# Patient Record
Sex: Female | Born: 2000 | Hispanic: Yes | Marital: Single | Smoking: Never smoker
Health system: Southern US, Community
[De-identification: ages and names within clinical notes are randomized; demographics above are authoritative.]

---

## 2021-03-13 ENCOUNTER — Emergency Department: Payer: Medicaid Other

## 2021-03-13 ENCOUNTER — Other Ambulatory Visit: Payer: Self-pay

## 2021-03-13 ENCOUNTER — Encounter: Payer: Self-pay | Admitting: Emergency Medicine

## 2021-03-13 DIAGNOSIS — R1013 Epigastric pain: Secondary | ICD-10-CM | POA: Diagnosis not present

## 2021-03-13 DIAGNOSIS — R0789 Other chest pain: Secondary | ICD-10-CM | POA: Insufficient documentation

## 2021-03-13 DIAGNOSIS — R110 Nausea: Secondary | ICD-10-CM | POA: Insufficient documentation

## 2021-03-13 DIAGNOSIS — R051 Acute cough: Secondary | ICD-10-CM | POA: Diagnosis not present

## 2021-03-13 DIAGNOSIS — R0981 Nasal congestion: Secondary | ICD-10-CM | POA: Diagnosis not present

## 2021-03-13 DIAGNOSIS — Z20822 Contact with and (suspected) exposure to covid-19: Secondary | ICD-10-CM | POA: Diagnosis not present

## 2021-03-13 DIAGNOSIS — E860 Dehydration: Secondary | ICD-10-CM | POA: Insufficient documentation

## 2021-03-13 LAB — URINALYSIS, COMPLETE (UACMP) WITH MICROSCOPIC
Bacteria, UA: NONE SEEN
Bilirubin Urine: NEGATIVE
Glucose, UA: NEGATIVE mg/dL
Ketones, ur: 20 mg/dL — AB
Leukocytes,Ua: NEGATIVE
Nitrite: NEGATIVE
Protein, ur: NEGATIVE mg/dL
Specific Gravity, Urine: 1.024 (ref 1.005–1.030)
pH: 5 (ref 5.0–8.0)

## 2021-03-13 LAB — COMPREHENSIVE METABOLIC PANEL
ALT: 17 U/L (ref 0–44)
AST: 14 U/L — ABNORMAL LOW (ref 15–41)
Albumin: 4.3 g/dL (ref 3.5–5.0)
Alkaline Phosphatase: 60 U/L (ref 38–126)
Anion gap: 8 (ref 5–15)
BUN: 13 mg/dL (ref 6–20)
CO2: 26 mmol/L (ref 22–32)
Calcium: 9.2 mg/dL (ref 8.9–10.3)
Chloride: 103 mmol/L (ref 98–111)
Creatinine, Ser: 0.42 mg/dL — ABNORMAL LOW (ref 0.44–1.00)
GFR, Estimated: >60 mL/min (ref >60)
Glucose, Bld: 89 mg/dL (ref 70–99)
Potassium: 3.7 mmol/L (ref 3.5–5.1)
Sodium: 137 mmol/L (ref 135–145)
Total Bilirubin: 0.9 mg/dL (ref 0.3–1.2)
Total Protein: 7.4 g/dL (ref 6.5–8.1)

## 2021-03-13 LAB — CBC
HCT: 37.1 % (ref 36.0–46.0)
Hemoglobin: 12.8 g/dL (ref 12.0–15.0)
MCH: 30.2 pg (ref 26.0–34.0)
MCHC: 34.5 g/dL (ref 30.0–36.0)
MCV: 87.5 fL (ref 80.0–100.0)
Platelets: 270 10*3/uL (ref 150–400)
RBC: 4.24 MIL/uL (ref 3.87–5.11)
RDW: 13.9 % (ref 11.5–15.5)
WBC: 6.1 10*3/uL (ref 4.0–10.5)
nRBC: 0 % (ref 0.0–0.2)

## 2021-03-13 LAB — LIPASE, BLOOD: Lipase: 27 U/L (ref 11–51)

## 2021-03-13 NOTE — ED Triage Notes (Signed)
Pt states she woke up at around 3 in the morning with abdominal pain. Pt states she also feels congestion. Pt reports a cough and nausea with this cough.

## 2021-03-14 ENCOUNTER — Other Ambulatory Visit: Payer: Self-pay

## 2021-03-14 ENCOUNTER — Emergency Department
Admission: EM | Admit: 2021-03-14 | Discharge: 2021-03-14 | Disposition: A | Discharge status: Home / Self Care | Payer: Medicaid Other | Attending: Emergency Medicine | Admitting: Emergency Medicine

## 2021-03-14 DIAGNOSIS — R051 Acute cough: Secondary | ICD-10-CM

## 2021-03-14 DIAGNOSIS — E860 Dehydration: Secondary | ICD-10-CM

## 2021-03-14 DIAGNOSIS — R079 Chest pain, unspecified: Secondary | ICD-10-CM

## 2021-03-14 LAB — RESP PANEL BY RT-PCR (FLU A&B, COVID) ARPGX2
Influenza A by PCR: NEGATIVE
Influenza B by PCR: NEGATIVE
SARS Coronavirus 2 by RT PCR: NEGATIVE

## 2021-03-14 LAB — D-DIMER, QUANTITATIVE: D-Dimer, Quant: 0.31 ug/mL-FEU (ref 0.00–0.50)

## 2021-03-14 LAB — PREGNANCY, URINE: Preg Test, Ur: NEGATIVE

## 2021-03-14 MED ORDER — HYDROCOD POLST-CPM POLST ER 10-8 MG/5ML PO SUER
5.0000 mL | Freq: Once | ORAL | Status: AC
  Administered 2021-03-14: 5 mL via ORAL
  Filled 2021-03-14: qty 5

## 2021-03-14 MED ORDER — SODIUM CHLORIDE 0.9 % IV BOLUS
1000.0000 mL | Freq: Once | INTRAVENOUS | Status: AC
  Administered 2021-03-14: 1000 mL via INTRAVENOUS

## 2021-03-14 MED ORDER — HYDROCOD POLST-CPM POLST ER 10-8 MG/5ML PO SUER: 5.0000 mL | Freq: Two times a day (BID) | ORAL | 0 refills | Status: DC

## 2021-03-14 MED ORDER — KETOROLAC TROMETHAMINE 30 MG/ML IJ SOLN
15.0000 mg | Freq: Once | INTRAMUSCULAR | Status: AC
  Administered 2021-03-14: 15 mg via INTRAVENOUS
  Filled 2021-03-14: qty 1

## 2021-03-14 NOTE — ED Provider Notes (Signed)
[facility] Emergency Department Provider Note   ____________________________________________   Event Date/Time   First MD Initiated Contact with Patient 03/14/21 0154     (approximate)  I have reviewed the triage vital signs and the nursing notes.   HISTORY  Chief Complaint Abdominal Pain    HPI Latoya Perkins is a 20 y.o. female who presents to the ED from home with a chief complaint of upper abdominal/lower chest pain.  Awoke around 3 AM with discomfort.  Endorses several day history of cough, congestion and nausea.  Denies fever, shortness of breath, vomiting, dysuria or diarrhea.  Denies recent travel, trauma or OCP use      Past medical history None  There are no problems to display for this patient.   History reviewed. No pertinent surgical history.  Prior to Admission medications   Medication Sig Start Date End Date Taking? Authorizing Provider  chlorpheniramine-HYDROcodone (TUSSIONEX PENNKINETIC ER) 10-8 MG/5ML SUER Take 5 mLs by mouth 2 (two) times daily. 03/14/21  Yes Irean Hong, MD    Allergies Patient has no allergy information on record.  No family history on file.  Social History    Review of Systems  Constitutional: No fever/chills Eyes: No visual changes. ENT: Positive for congestion.  No sore throat. Cardiovascular: Positive for chest pain. Respiratory: Positive for cough.  Denies shortness of breath. Gastrointestinal: No abdominal pain.  Positive for nausea, no vomiting.  No diarrhea.  No constipation. Genitourinary: Negative for dysuria. Musculoskeletal: Negative for back pain. Skin: Negative for rash. Neurological: Negative for headaches, focal weakness or numbness.   ____________________________________________   PHYSICAL EXAM:  VITAL SIGNS: ED Triage Vitals  Enc Vitals Group     BP 03/13/21 1633 98/73     Pulse Rate 03/13/21 1633 97     Resp 03/13/21 1633 16     Temp 03/13/21 1633 98.4 F  (36.9 C)     Temp Source 03/13/21 1633 Oral     SpO2 03/13/21 1633 98 %     Weight --      Height --      Head Circumference --      Peak Flow --      Pain Score 03/13/21 1634 6     Pain Loc --      Pain Edu? --      Excl. in GC? --     Constitutional: Alert and oriented. Well appearing and in no acute distress. Eyes: Conjunctivae are normal. PERRL. EOMI. Head: Atraumatic. Nose: Congestion/rhinnorhea. Mouth/Throat: Mucous membranes are moist.  Oropharynx non-erythematous. Neck: No stridor.   Cardiovascular: Normal rate, regular rhythm. Grossly normal heart sounds.  Good peripheral circulation.  Tender to palpation xiphoid process. Respiratory: Dry cough noted.  Normal respiratory effort.  No retractions. Lungs CTAB. Gastrointestinal: Soft and nontender. No distention. No abdominal bruits. No CVA tenderness. Musculoskeletal: No lower extremity tenderness nor edema.  No joint effusions. Neurologic:  Normal speech and language. No gross focal neurologic deficits are appreciated. No gait instability. Skin:  Skin is warm, dry and intact. No rash noted. Psychiatric: Mood and affect are normal. Speech and behavior are normal.  ____________________________________________   LABS (all labs ordered are listed, but only abnormal results are displayed)  Labs Reviewed  COMPREHENSIVE METABOLIC PANEL - Abnormal; Notable for the following components:      Result Value   Creatinine, Ser 0.42 (*)    AST 14 (*)    All other components within normal limits  URINALYSIS,  COMPLETE (UACMP) WITH MICROSCOPIC - Abnormal; Notable for the following components:   Color, Urine YELLOW (*)    APPearance HAZY (*)    Hgb urine dipstick SMALL (*)    Ketones, ur 20 (*)    All other components within normal limits  RESP PANEL BY RT-PCR (FLU A&B, COVID) ARPGX2  LIPASE, BLOOD  CBC  PREGNANCY, URINE  D-DIMER, QUANTITATIVE  POC URINE PREG, ED   ____________________________________________  EKG  ED ECG  REPORT I, Raynold Blankenbaker J, the attending physician, personally viewed and interpreted this ECG.   Date: 03/14/2021  EKG Time: 0330  Rate: 83  Rhythm: normal sinus rhythm  Axis: Normal  Intervals:none  ST&T Change: Nonspecific  ____________________________________________  RADIOLOGY I, Bertha Earwood J, personally viewed and evaluated these images (plain radiographs) as part of my medical decision making, as well as reviewing the written report by the radiologist.  ED MD interpretation: No acute cardiopulmonary process  Official radiology report(s): DG Chest 2 View  Result Date: 03/13/2021 CLINICAL DATA:  Cough EXAM: CHEST - 2 VIEW COMPARISON:  None. FINDINGS: The heart size and mediastinal contours are within normal limits. Both lungs are clear. Scoliotic curvature of the thoracolumbar spine. IMPRESSION: No acute cardiopulmonary disease. Electronically Signed   By: Maudry Mayhew M.D.   On: 03/13/2021 23:44    ____________________________________________   PROCEDURES  Procedure(s) performed (including Critical Care):  Procedures   ____________________________________________   INITIAL IMPRESSION / ASSESSMENT AND PLAN / ED COURSE  As part of my medical decision making, I reviewed the following data within the electronic MEDICAL RECORD NUMBER Nursing notes reviewed and incorporated, Labs reviewed, EKG interpreted, Old chart reviewed, Radiograph reviewed, and Notes from prior ED visits     20 year old female presenting with cough, congestion, nausea, chest/epigastric pain. Differential diagnosis includes, but is not limited to, ACS, aortic dissection, pulmonary embolism, cardiac tamponade, pneumothorax, pneumonia, pericarditis, myocarditis, GI-related causes including esophagitis/gastritis, and musculoskeletal chest wall pain.     Laboratory results remarkable for ketonuria.  Will check respiratory panel, D-dimer, EKG.  Administer IV fluid hydration, Toradol and Tussionex for cough.  Will  reassess.  Clinical Course as of 03/14/21 8469  Sat Mar 14, 2021  [id number] Patient feeling better, texting on her cell phone.  Updated her on all test results.  Strict return precautions given.  Patient verbalizes understanding and agrees with plan of care. [JS]    Clinical Course User Index [JS] Irean Hong, MD     ____________________________________________   FINAL CLINICAL IMPRESSION(S) / ED DIAGNOSES  Final diagnoses:  Acute cough  Dehydration  Chest pain, unspecified type     ED Discharge Orders          Ordered    chlorpheniramine-HYDROcodone (TUSSIONEX PENNKINETIC ER) 10-8 MG/5ML SUER  2 times daily        03/14/21 0438             Note:  This document was prepared using Dragon voice recognition software and may include unintentional dictation errors.    Irean Hong, MD 03/14/21 [phone]

## 2021-03-14 NOTE — Discharge Instructions (Signed)
You may take Tylenol and/or Ibuprofen as needed.  Take Tussionex as needed for cough.  Drink plenty of fluids daily.  Return to the ER for worsening symptoms, persistent vomiting, difficulty breathing or other concerns.

## 2021-06-12 ENCOUNTER — Emergency Department
Admission: EM | Admit: 2021-06-12 | Discharge: 2021-06-12 | Discharge status: Against Medical Advice | Payer: Medicaid Other | Source: Home / Self Care

## 2021-06-16 ENCOUNTER — Other Ambulatory Visit: Payer: Self-pay

## 2021-06-16 ENCOUNTER — Emergency Department: Payer: Medicaid Other

## 2021-06-16 ENCOUNTER — Emergency Department
Admission: EM | Admit: 2021-06-16 | Discharge: 2021-06-16 | Disposition: A | Discharge status: Home / Self Care | Payer: Medicaid Other | Attending: Emergency Medicine | Admitting: Emergency Medicine

## 2021-06-16 ENCOUNTER — Encounter: Payer: Self-pay | Admitting: *Deleted

## 2021-06-16 DIAGNOSIS — N10 Acute tubulo-interstitial nephritis: Secondary | ICD-10-CM | POA: Insufficient documentation

## 2021-06-16 DIAGNOSIS — R319 Hematuria, unspecified: Secondary | ICD-10-CM | POA: Diagnosis present

## 2021-06-16 DIAGNOSIS — N12 Tubulo-interstitial nephritis, not specified as acute or chronic: Secondary | ICD-10-CM

## 2021-06-16 LAB — URINALYSIS, ROUTINE W REFLEX MICROSCOPIC
Bilirubin Urine: NEGATIVE
Glucose, UA: NEGATIVE mg/dL
Ketones, ur: 5 mg/dL — AB
Nitrite: NEGATIVE
Protein, ur: 100 mg/dL — AB
RBC / HPF: >50 RBC/hpf — ABNORMAL HIGH (ref 0–5)
Specific Gravity, Urine: 1.023 (ref 1.005–1.030)
WBC, UA: >50 WBC/hpf — ABNORMAL HIGH (ref 0–5)
pH: 5 (ref 5.0–8.0)

## 2021-06-16 LAB — CBC
HCT: 33.8 % — ABNORMAL LOW (ref 36.0–46.0)
Hemoglobin: 11.4 g/dL — ABNORMAL LOW (ref 12.0–15.0)
MCH: 29.6 pg (ref 26.0–34.0)
MCHC: 33.7 g/dL (ref 30.0–36.0)
MCV: 87.8 fL (ref 80.0–100.0)
Platelets: 269 10*3/uL (ref 150–400)
RBC: 3.85 MIL/uL — ABNORMAL LOW (ref 3.87–5.11)
RDW: 13.5 % (ref 11.5–15.5)
WBC: 9.3 10*3/uL (ref 4.0–10.5)
nRBC: 0 % (ref 0.0–0.2)

## 2021-06-16 LAB — COMPREHENSIVE METABOLIC PANEL
ALT: 13 U/L (ref 0–44)
AST: 15 U/L (ref 15–41)
Albumin: 4.2 g/dL (ref 3.5–5.0)
Alkaline Phosphatase: 61 U/L (ref 38–126)
Anion gap: 3 — ABNORMAL LOW (ref 5–15)
BUN: 17 mg/dL (ref 6–20)
CO2: 26 mmol/L (ref 22–32)
Calcium: 8.8 mg/dL — ABNORMAL LOW (ref 8.9–10.3)
Chloride: 103 mmol/L (ref 98–111)
Creatinine, Ser: 0.43 mg/dL — ABNORMAL LOW (ref 0.44–1.00)
GFR, Estimated: >60 mL/min (ref >60)
Glucose, Bld: 91 mg/dL (ref 70–99)
Potassium: 3.4 mmol/L — ABNORMAL LOW (ref 3.5–5.1)
Sodium: 132 mmol/L — ABNORMAL LOW (ref 135–145)
Total Bilirubin: 0.7 mg/dL (ref 0.3–1.2)
Total Protein: 7.5 g/dL (ref 6.5–8.1)

## 2021-06-16 LAB — POC URINE PREG, ED: Preg Test, Ur: NEGATIVE

## 2021-06-16 MED ORDER — SODIUM CHLORIDE 0.9 % IV SOLN
1.0000 g | Freq: Once | INTRAVENOUS | Status: AC
  Administered 2021-06-16: 1 g via INTRAVENOUS
  Filled 2021-06-16: qty 10

## 2021-06-16 MED ORDER — KETOROLAC TROMETHAMINE 30 MG/ML IJ SOLN
15.0000 mg | Freq: Once | INTRAMUSCULAR | Status: AC
  Administered 2021-06-16: 15 mg via INTRAVENOUS
  Filled 2021-06-16: qty 1

## 2021-06-16 MED ORDER — ONDANSETRON 4 MG PO TBDP: 4.0000 mg | ORAL_TABLET | Freq: Three times a day (TID) | ORAL | 0 refills | Status: DC | PRN

## 2021-06-16 MED ORDER — LACTATED RINGERS IV BOLUS
1000.0000 mL | Freq: Once | INTRAVENOUS | Status: AC
  Administered 2021-06-16: 1000 mL via INTRAVENOUS

## 2021-06-16 MED ORDER — CEFPODOXIME PROXETIL 200 MG PO TABS: 200.0000 mg | ORAL_TABLET | Freq: Two times a day (BID) | ORAL | 0 refills | Status: AC

## 2021-06-16 NOTE — ED Notes (Signed)
Patient transported to CT 

## 2021-06-16 NOTE — ED Triage Notes (Signed)
Pt reports blood in urine and lower back pain.  Pt has dysuria.  No vag discharge or bleeding.  Pt alert.  Interpreter on a stick used in triage.

## 2021-06-16 NOTE — ED Provider Notes (Signed)
[facility] Provider Note    Event Date/Time   First MD Initiated Contact with Patient 06/16/21 [phone]     (approximate)   History   Chief Complaint Hematuria   HPI  Demetrise Youngdahl is a 21 y.o. female with no significant past medical history presents to the ED complaining of hematuria.  History is limited as patient is Spanish-speaking only and history obtained via video interpreter [phone].  Patient reports that she has had about 4 days of burning when she urinates, has started to notice blood mixed into her urine over the past couple of days.  This is associated with pain in both sides of her lower back and moving up towards both flanks.  She has not had any fevers and denies any nausea, vomiting, or diarrhea.  She denies any history of similar symptoms in the past, denies prior kidney stones or kidney infections.  She has not taken anything for her symptoms at home.  Her LMP was approximately 3 weeks ago and she denies any vaginal bleeding or discharge.     Physical Exam   Triage Vital Signs: ED Triage Vitals  Enc Vitals Group     BP 06/16/21 0200 108/75     Pulse Rate 06/16/21 0200 96     Resp 06/16/21 0200 15     Temp 06/16/21 0200 98.9 F (37.2 C)     Temp Source 06/16/21 0200 Oral     SpO2 06/16/21 0200 100 %     Weight 06/16/21 0200 100 lb (45.4 kg)     Height 06/16/21 0200 4\' 8"  (1.422 m)     Head Circumference --      Peak Flow --      Pain Score 06/16/21 0200 5     Pain Loc --      Pain Edu? --      Excl. in [place]? --     Most recent vital signs: Vitals:   06/16/21 0430 06/16/21 0500  BP: 117/78 111/72  Pulse: 86 94  Resp: 14 14  Temp:    SpO2: 100% 100%    Constitutional: Alert and oriented. Eyes: Conjunctivae are normal. Head: Atraumatic. Nose: No congestion/rhinnorhea. Mouth/Throat: Mucous membranes are moist.  Cardiovascular: Normal rate, regular rhythm. Grossly normal heart sounds.  2+ radial pulses  bilaterally. Respiratory: Normal respiratory effort.  No retractions. Lungs CTAB. Gastrointestinal: Soft and tender to palpation in the suprapubic area with no rebound or guarding.  CVA tenderness to palpation noted bilaterally. No distention. Musculoskeletal: No lower extremity tenderness nor edema.  Neurologic:  Normal speech and language. No gross focal neurologic deficits are appreciated.    ED Results / Procedures / Treatments   Labs (all labs ordered are listed, but only abnormal results are displayed) Labs Reviewed  CBC - Abnormal; Notable for the following components:      Result Value   RBC 3.85 (*)    Hemoglobin 11.4 (*)    HCT 33.8 (*)    All other components within normal limits  COMPREHENSIVE METABOLIC PANEL - Abnormal; Notable for the following components:   Sodium 132 (*)    Potassium 3.4 (*)    Creatinine, Ser 0.43 (*)    Calcium 8.8 (*)    Anion gap 3 (*)    All other components within normal limits  URINALYSIS, ROUTINE W REFLEX MICROSCOPIC - Abnormal; Notable for the following components:   Color, Urine YELLOW (*)    APPearance CLOUDY (*)    Hgb  urine dipstick LARGE (*)    Ketones, ur 5 (*)    Protein, ur 100 (*)    Leukocytes,Ua LARGE (*)    RBC / HPF >50 (*)    WBC, UA >50 (*)    Bacteria, UA FEW (*)    All other components within normal limits  URINE CULTURE  POC URINE PREG, ED    RADIOLOGY CT of abdomen/pelvis reviewed by me with no obvious ureterolithiasis or other urinary obstruction.  PROCEDURES:  Critical Care performed: No  Procedures   MEDICATIONS ORDERED IN ED: Medications  cefTRIAXone (ROCEPHIN) 1 g in sodium chloride 0.9 % 100 mL IVPB (1 g Intravenous New Bag/Given 06/16/21 0524)  lactated ringers bolus 1,000 mL (0 mLs Intravenous Stopped 06/16/21 0521)  ketorolac (TORADOL) 30 MG/ML injection 15 mg (15 mg Intravenous Given 06/16/21 0422)     IMPRESSION / MDM / ASSESSMENT AND PLAN / ED COURSE  I reviewed the triage vital signs and  the nursing notes.                              21 y.o. female with no significant past medical history presents to the ED with 4 days of increasing dysuria, hematuria, lower abdominal pain, and flank pain.  Differential diagnosis includes, but is not limited to, pyelonephritis, cystitis, nephrolithiasis, other ureteral obstruction, pregnancy.  Patient is nontoxic-appearing and in no acute distress, vital signs are reassuring and she is comfortable appearing.  Pain is reproducible with palpation of her suprapubic area as well as her bilateral costovertebral areas.  Pregnancy testing is negative but UA is concerning for infection and symptoms consistent with pyelonephritis.  We will send urine for culture and give initial dose of Rocephin, hydrate with IV fluids and treat symptomatically with IV Toradol.  We will further assess with CT renal protocol to ensure no ureterolithiasis or other contributing obstruction.  CBC and BMP are unremarkable, show no anemia or electrolyte abnormality.  CT renal protocol is negative for ureterolithiasis or other acute process, on reassessment patient is feeling better with improved pain.  Admission was considered for pyelonephritis, however given patient's reassuring vital signs with improving symptoms, she is appropriate for outpatient management.  We will prescribe 10-day course of cefpodoxime along with Zofran for use as needed.  She was counseled to establish care with PCP and to return to the ED for new worsening symptoms, patient agrees with plan.       FINAL CLINICAL IMPRESSION(S) / ED DIAGNOSES   Final diagnoses:  Pyelonephritis     Rx / DC Orders   ED Discharge Orders          Ordered    ondansetron (ZOFRAN-ODT) 4 MG disintegrating tablet  Every 8 hours PRN        06/16/21 0528    cefpodoxime (VANTIN) 200 MG tablet  2 times daily        06/16/21 [id number]             Note:  This document was prepared using Dragon voice recognition software  and may include unintentional dictation errors.   Blake Divine, MD 06/16/21 [phone]

## 2021-06-18 LAB — URINE CULTURE: Culture: >=100000 — AB

## 2021-08-11 ENCOUNTER — Other Ambulatory Visit: Payer: Self-pay

## 2021-08-11 ENCOUNTER — Emergency Department
Admission: EM | Admit: 2021-08-11 | Discharge: 2021-08-11 | Disposition: A | Discharge status: Home / Self Care | Payer: Medicaid Other | Attending: Emergency Medicine | Admitting: Emergency Medicine

## 2021-08-11 ENCOUNTER — Emergency Department: Payer: Medicaid Other

## 2021-08-11 ENCOUNTER — Encounter: Payer: Self-pay | Admitting: Emergency Medicine

## 2021-08-11 DIAGNOSIS — S6992XA Unspecified injury of left wrist, hand and finger(s), initial encounter: Secondary | ICD-10-CM | POA: Diagnosis present

## 2021-08-11 DIAGNOSIS — W208XXA Other cause of strike by thrown, projected or falling object, initial encounter: Secondary | ICD-10-CM | POA: Insufficient documentation

## 2021-08-11 DIAGNOSIS — Y990 Civilian activity done for income or pay: Secondary | ICD-10-CM | POA: Diagnosis not present

## 2021-08-11 DIAGNOSIS — S60222A Contusion of left hand, initial encounter: Secondary | ICD-10-CM | POA: Insufficient documentation

## 2021-08-11 MED ORDER — IBUPROFEN 100 MG/5ML PO SUSP
600.0000 mg | Freq: Once | ORAL | Status: AC
Start: 2021-08-11 — End: 2021-08-11
  Filled 2021-08-11: qty 30

## 2021-08-11 MED ORDER — IBUPROFEN 100 MG/5ML PO SUSP
ORAL | Status: AC
  Administered 2021-08-11: 600 mg via ORAL
  Filled 2021-08-11: qty 30

## 2021-08-11 MED ORDER — IBUPROFEN 600 MG PO TABS
600.0000 mg | ORAL_TABLET | Freq: Once | ORAL | Status: DC
  Filled 2021-08-11: qty 1

## 2021-08-11 NOTE — ED Notes (Signed)
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

## 2021-08-11 NOTE — ED Provider Notes (Signed)
? ?[facility] ?Provider Note ? ? ? Event Date/Time  ? First MD Initiated Contact with Patient 08/11/21 0143   ?  (approximate) ? ? ?History  ? ?Hand Pain ? ? ?HPI ? ?Latoya Perkins is a 21 y.o. female no significant past medical history who presents for evaluation of left hand pain.  Patient reports that she was at work trying to move a very heavy pallet.  The box fell from a top shelf and her hand got caught between the forklift and the pallet.  She is complaining of pain mostly in the thumb area of the left hand.  No obvious deformities.  She denies any other injuries.  She has not taken anything for pain prior to arrival ?  ? ? ?History reviewed. No pertinent past medical history. ? ?History reviewed. No pertinent surgical history. ? ? ?Physical Exam  ? ?Triage Vital Signs: ?ED Triage Vitals  ?Enc Vitals Group  ?   BP 08/11/21 0137 103/64  ?   Pulse Rate 08/11/21 0137 88  ?   Resp 08/11/21 0137 20  ?   Temp 08/11/21 0137 98.1 ?F (36.7 ?C)  ?   Temp Source 08/11/21 0137 Oral  ?   SpO2 08/11/21 0137 96 %  ?   Weight 08/11/21 0135 90 lb (40.8 kg)  ?   Height 08/11/21 0135 4\' 10"  (1.473 m)  ?   Head Circumference --   ?   Peak Flow --   ?   Pain Score 08/11/21 0135 8  ?   Pain Loc --   ?   Pain Edu? --   ?   Excl. in GC? --   ? ? ?Most recent vital signs: ?Vitals:  ? 08/11/21 0137  ?BP: 103/64  ?Pulse: 88  ?Resp: 20  ?Temp: 98.1 ?F (36.7 ?C)  ?SpO2: 96%  ? ? ? ?Constitutional: Alert and oriented. No acute distress. Does not appear intoxicated. ?HEENT ?Head: Normocephalic and atraumatic. ?Eyes: No eye injury. PERRL. No raccoon eyes ?Neck: no C-collar. No midline c-spine tenderness.  ?Cardiovascular: Normal rate, regular rhythm. Normal and symmetric distal pulses are present in all extremities. ?Pulmonary/Chest: Chest wall is stable and nontender to palpation/compression. Normal respiratory effort. Breath sounds are normal. No crepitus.  ?Musculoskeletal: Mild swelling of the base of the  thumb on the left, skin intact, no deformities. Nontender with normal full range of motion in all other bones and joints of the LUE ?Skin: Skin is warm, dry and intact. No abrasions or contutions. ?Psychiatric: Speech and behavior are appropriate. ?Neurological: Normal speech and language. Moves all extremities to command. No gross focal neurologic deficits are appreciated. ? ?ED Results / Procedures / Treatments  ? ?Labs ?(all labs ordered are listed, but only abnormal results are displayed) ?Labs Reviewed - No data to display ? ? ?EKG ? ?none ? ? ?RADIOLOGY ?I, 08/13/21, attending MD, have personally viewed and interpreted the images obtained during this visit as below: ? ?X-ray negative for fracture ? ? ?___________________________________________________ ?Interpretation by Radiologist:  ?DG Hand Complete Left ? ?Result Date: 08/11/2021 ?CLINICAL DATA:  Trauma and pain in the left hand. EXAM: LEFT HAND - COMPLETE 3+ VIEW COMPARISON:  None. FINDINGS: No acute fracture or dislocation. 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 No arthritic changes. The soft tissues are unremarkable. IMPRESSION: No acute fracture or dislocation. Electronically Signed   By: 08/13/2021 M.D.   On: 08/11/2021 02:13   ? ? ? ?PROCEDURES: ? ?Critical Care performed: No ? ?  Procedures ? ? ? ?IMPRESSION / MDM / ASSESSMENT AND PLAN / ED COURSE  ?I reviewed the triage vital signs and the nursing notes. ? ?21 y.o. female no significant past medical history who presents for evaluation of left hand pain.  Patient with traumatic left-sided hand pain.  On exam there is mild swelling but no deformities, skin is intact.  Differential diagnoses including contusion versus fracture versus dislocation.  X-ray was done which shows no acute traumatic injury.  Recommended rice therapy, ibuprofen for pain.  Patient was given an Ace wrap.  Discussed follow-up with primary care doctor and my standard return precautions.  No signs of compartment  syndrome ? ? ?MEDICATIONS GIVEN IN ED: ?Medications  ?ibuprofen (ADVIL) tablet 600 mg (has no administration in time range)  ? ? ? ?FINAL CLINICAL IMPRESSION(S) / ED DIAGNOSES  ? ?Final diagnoses:  ?Contusion of left hand, initial encounter  ? ? ? ?Rx / DC Orders  ? ?ED Discharge Orders   ? ? None  ? ?  ? ? ? ?Note:  This document was prepared using Dragon voice recognition software and may include unintentional dictation errors. ? ? ?Please note:  Patient was evaluated in Emergency Department today for the symptoms described in the history of present illness. Patient was evaluated in the context of the global COVID-19 pandemic, which necessitated consideration that the patient might be at risk for infection with the SARS-CoV-2 virus that causes COVID-19. Institutional protocols and algorithms that pertain to the evaluation of patients at risk for COVID-19 are in a state of rapid change based on information released by regulatory bodies including the CDC and federal and state organizations. These policies and algorithms were followed during the patient's care in the ED.  Some ED evaluations and interventions may be delayed as a result of limited staffing during the pandemic. ? ? ? ? ?  ?Nita Sickle, MD ?08/11/21 0231 ? ?

## 2021-08-11 NOTE — ED Triage Notes (Signed)
Pt to ED via POV with c/o L hand/thumb pain. Pt states was at work and got her hand caught between forklift and pallet, pt states pain, some swelling noted at this time.  ?

## 2021-08-31 ENCOUNTER — Emergency Department: Payer: Medicaid Other

## 2021-08-31 ENCOUNTER — Other Ambulatory Visit: Payer: Self-pay

## 2021-08-31 ENCOUNTER — Emergency Department
Admission: EM | Admit: 2021-08-31 | Discharge: 2021-08-31 | Disposition: A | Discharge status: Home / Self Care | Payer: Medicaid Other | Attending: Emergency Medicine | Admitting: Emergency Medicine

## 2021-08-31 DIAGNOSIS — R519 Headache, unspecified: Secondary | ICD-10-CM | POA: Insufficient documentation

## 2021-08-31 DIAGNOSIS — M542 Cervicalgia: Secondary | ICD-10-CM | POA: Insufficient documentation

## 2021-08-31 DIAGNOSIS — Y92410 Unspecified street and highway as the place of occurrence of the external cause: Secondary | ICD-10-CM | POA: Insufficient documentation

## 2021-08-31 DIAGNOSIS — O9A211 Injury, poisoning and certain other consequences of external causes complicating pregnancy, first trimester: Secondary | ICD-10-CM | POA: Insufficient documentation

## 2021-08-31 LAB — COMPREHENSIVE METABOLIC PANEL
ALT: 15 U/L (ref 0–44)
AST: 18 U/L (ref 15–41)
Albumin: 4.5 g/dL (ref 3.5–5.0)
Alkaline Phosphatase: 53 U/L (ref 38–126)
Anion gap: 9 (ref 5–15)
BUN: 11 mg/dL (ref 6–20)
CO2: 22 mmol/L (ref 22–32)
Calcium: 9.3 mg/dL (ref 8.9–10.3)
Chloride: 103 mmol/L (ref 98–111)
Creatinine, Ser: 0.44 mg/dL (ref 0.44–1.00)
GFR, Estimated: >60 mL/min (ref >60)
Glucose, Bld: 107 mg/dL — ABNORMAL HIGH (ref 70–99)
Potassium: 3.9 mmol/L (ref 3.5–5.1)
Sodium: 134 mmol/L — ABNORMAL LOW (ref 135–145)
Total Bilirubin: 0.8 mg/dL (ref 0.3–1.2)
Total Protein: 8.7 g/dL — ABNORMAL HIGH (ref 6.5–8.1)

## 2021-08-31 LAB — CBC WITH DIFFERENTIAL/PLATELET
Abs Immature Granulocytes: 0.03 10*3/uL (ref 0.00–0.07)
Basophils Absolute: 0 10*3/uL (ref 0.0–0.1)
Basophils Relative: 0 %
Eosinophils Absolute: 0.1 10*3/uL (ref 0.0–0.5)
Eosinophils Relative: 1 %
HCT: 39.8 % (ref 36.0–46.0)
Hemoglobin: 13.2 g/dL (ref 12.0–15.0)
Immature Granulocytes: 0 %
Lymphocytes Relative: 27 %
Lymphs Abs: 2.8 10*3/uL (ref 0.7–4.0)
MCH: 29 pg (ref 26.0–34.0)
MCHC: 33.2 g/dL (ref 30.0–36.0)
MCV: 87.5 fL (ref 80.0–100.0)
Monocytes Absolute: 0.5 10*3/uL (ref 0.1–1.0)
Monocytes Relative: 4 %
Neutro Abs: 7.1 10*3/uL (ref 1.7–7.7)
Neutrophils Relative %: 68 %
Platelets: 323 10*3/uL (ref 150–400)
RBC: 4.55 MIL/uL (ref 3.87–5.11)
RDW: 14.2 % (ref 11.5–15.5)
WBC: 10.6 10*3/uL — ABNORMAL HIGH (ref 4.0–10.5)
nRBC: 0 % (ref 0.0–0.2)

## 2021-08-31 LAB — POC URINE PREG, ED: Preg Test, Ur: POSITIVE — AB

## 2021-08-31 LAB — HCG, QUANTITATIVE, PREGNANCY: hCG, Beta Chain, Quant, S: 198482 m[IU]/mL — ABNORMAL HIGH (ref <5)
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
## 2021-09-07 ENCOUNTER — Encounter: Payer: Self-pay | Admitting: Certified Nurse Midwife

## 2021-09-07 ENCOUNTER — Ambulatory Visit (INDEPENDENT_AMBULATORY_CARE_PROVIDER_SITE_OTHER): Payer: Medicaid Other | Admitting: Certified Nurse Midwife

## 2021-09-07 VITALS — BP 107/71 | HR 118 | Ht 60.0 in | Wt 81.6 lb

## 2021-09-07 DIAGNOSIS — Z3200 Encounter for pregnancy test, result unknown: Secondary | ICD-10-CM | POA: Diagnosis not present

## 2021-09-07 DIAGNOSIS — Z3401 Encounter for supervision of normal first pregnancy, first trimester: Secondary | ICD-10-CM

## 2021-09-07 MED ORDER — CVS PRENATAL GUMMY 0.4-113.5 MG PO CHEW: 1.0000 | CHEWABLE_TABLET | Freq: Every day | ORAL | 3 refills | Status: AC

## 2021-09-07 NOTE — Progress Notes (Signed)
Subjective:  ? ? Latoya Perkins is a 21 y.o. female who presents for evaluation of amenorrhea. She believes she could be pregnant. Pregnancy is desired. Sexual Activity: single partner, contraception: none. Current symptoms also include: nausea. Last period was normal. ?  ?Patient's last menstrual period was 07/28/2021. ?The following portions of the patient's history were reviewed and updated as appropriate: allergies, current medications, past family history, past medical history, past social history, past surgical history, and problem list. ? ?Review of Systems ?Pertinent items are noted in HPI.   ?  ?Objective:  ? ? Ht 5' (1.524 m)   LMP 07/28/2021   BMI 16.60 kg/m?  ?General: alert, cooperative, appears stated age, no distress, and no acute distress   ? ?Lab Review ?Urine HCG: positive  ?  ?Assessment:  ? ? Absence of menstruation.   ?  ?Plan:  ? ? Pregnancy Test:  Positive: EDC: 05/04/2022. Briefly discussed pre-natal care options.  Encouraged well-balanced diet, plenty of rest when needed, pre-natal vitamins daily and walking for exercise. Discussed self-help for nausea, avoiding OTC medications until consulting provider or pharmacist, other than Tylenol as needed, minimal caffeine (1-2 cups daily) and avoiding alcohol. She will scheduled an u/s in 2 wks, nurse visit in 4 wks,  her initial OB visit in 6 wks . Feel free to call with any questions.  ? ? ?Doreene Burke, CNM  ?

## 2021-09-07 NOTE — Patient Instructions (Signed)
Prenatal Care ?Prenatal care is health care during pregnancy. It helps you and your unborn baby (fetus) stay as healthy as possible. Prenatal care may be provided by a midwife, a family practice doctor, a mid-level practitioner (nurse practitioner or physician assistant), or a childbirth and pregnancy doctor (obstetrician). ?How does this affect me? ?During pregnancy, you will be closely monitored for any new conditions that might develop. To lower your risk of pregnancy complications, you and your health care provider will talk about any underlying conditions you have. ?How does this affect my baby? ?Early and consistent prenatal care increases the chance that your baby will be healthy during pregnancy. Prenatal care lowers the risk that your baby will be: ?Born early (prematurely). ?Smaller than expected at birth (small for gestational age). ?What can I expect at the first prenatal care visit? ?Your first prenatal care visit will likely be the longest. You should schedule your first prenatal care visit as soon as you know that you are pregnant. Your first visit is a good time to talk about any questions or concerns you have about pregnancy. ?Medical history ?At your visit, you and your health care provider will talk about your medical history, including: ?Any past pregnancies. ?Your family's medical history. ?Medical history of the baby's father. ?Any long-term (chronic) health conditions you have and how you manage them. ?Any surgeries or procedures you have had. ?Any current over-the-counter or prescription medicines, herbs, or supplements that you are taking. ?Other factors that could pose a risk to your baby, including: ?Exposure to harmful chemicals or radiation at work or at home. ?Any substance use, including tobacco, alcohol, and drug use. ?Your home setting and your stress levels, including: ?Exposure to abuse or violence. ?Household financial strain. ?Your daily health habits, including diet and  exercise. ?Tests and screenings ?Your health care provider will: ?Measure your weight, height, and blood pressure. ?Do a physical exam, including a pelvic and breast exam. ?Perform blood tests and urine tests to check for: ?Urinary tract infection. ?Sexually transmitted infections (STIs). ?Low iron levels in your blood (anemia). ?Blood type and certain proteins on red blood cells (Rh antibodies). ?Infections and immunity to viruses, such as hepatitis B and rubella. ?HIV (human immunodeficiency virus). ?Discuss your options for genetic screening. ?Tips about staying healthy ?Your health care provider will also give you information about how to keep yourself and your baby healthy, including: ?Nutrition and taking vitamins. ?Physical activity. ?How to manage pregnancy symptoms such as nausea and vomiting (morning sickness). ?Infections and substances that may be harmful to your baby and how to avoid them. ?Food safety. ?Dental care. ?Working. ?Travel. ?Warning signs to watch for and when to call your health care provider. ?How often will I have prenatal care visits? ?After your first prenatal care visit, you will have regular visits throughout your pregnancy. The visit schedule is often as follows: ?Up to week 28 of pregnancy: once every 4 weeks. ?28-36 weeks: once every 2 weeks. ?After 36 weeks: every week until delivery. ?Some women may have visits more or less often depending on any underlying health conditions and the health of the baby. ?Keep all follow-up and prenatal care visits. This is important. ?What happens during routine prenatal care visits? ?Your health care provider will: ?Measure your weight and blood pressure. ?Check for fetal heart sounds. ?Measure the height of your uterus in your abdomen (fundal height). This may be measured starting around week 20 of pregnancy. ?Check the position of your baby inside your uterus. ?Ask questions   about your diet, sleeping patterns, and whether you can feel the baby  move. ?Review warning signs to watch for and signs of labor. ?Ask about any pregnancy symptoms you are having and how you are dealing with them. Symptoms may include: ?Headaches. ?Nausea and vomiting. ?Vaginal discharge. ?Swelling. ?Fatigue. ?Constipation. ?Changes in your vision. ?Feeling persistently sad or anxious. ?Any discomfort, including back or pelvic pain. ?Bleeding or spotting. ?Make a list of questions to ask your health care provider at your routine visits. ?What tests might I have during prenatal care visits? ?You may have blood, urine, and imaging tests throughout your pregnancy, such as: ?Urine tests to check for glucose, protein, or signs of infection. ?Glucose tests to check for a form of diabetes that can develop during pregnancy (gestational diabetes mellitus). This is usually done around week 24 of pregnancy. ?Ultrasounds to check your baby's growth and development, to check for birth defects, and to check your baby's well-being. These can also help to decide when you should deliver your baby. ?A test to check for group B strep (GBS) infection. This is usually done around week 36 of pregnancy. ?Genetic testing. This may include blood, fluid, or tissue sampling, or imaging tests, such as an ultrasound. Some genetic tests are done during the first trimester and some are done during the second trimester. ?What else can I expect during prenatal care visits? ?Your health care provider may recommend getting certain vaccines during pregnancy. These may include: ?A yearly flu shot (annual influenza vaccine). This is especially important if you will be pregnant during flu season. ?Tdap (tetanus, diphtheria, pertussis) vaccine. Getting this vaccine during pregnancy can protect your baby from whooping cough (pertussis) after birth. This vaccine may be recommended between weeks 27 and 36 of pregnancy. ?A COVID-19 vaccine. ?Later in your pregnancy, your health care provider may give you information  about: ?Childbirth and breastfeeding classes. ?Choosing a health care provider for your baby. ?Umbilical cord banking. ?Breastfeeding. ?Birth control after your baby is born. ?The hospital labor and delivery unit and how to set up a tour. ?Registering at the hospital before you go into labor. ?Where to find more information ?Office on Women's Health: womenshealth.gov ?American Pregnancy Association: americanpregnancy.org ?March of Dimes: marchofdimes.org ?Summary ?Prenatal care helps you and your baby stay as healthy as possible during pregnancy. ?Your first prenatal care visit will most likely be the longest. ?You will have visits and tests throughout your pregnancy to monitor your health and your baby's health. ?Bring a list of questions to your visits to ask your health care provider. ?Make sure to keep all follow-up and prenatal care visits. ?This information is not intended to replace advice given to you by your health care provider. Make sure you discuss any questions you have with your health care provider. ?Document Revised: 02/28/2020 Document Reviewed: 02/28/2020 ?Elsevier Patient Education ? 2022 Elsevier Inc. ? ?
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
## 2021-09-28 ENCOUNTER — Ambulatory Visit (INDEPENDENT_AMBULATORY_CARE_PROVIDER_SITE_OTHER): Payer: Medicaid Other

## 2021-09-28 DIAGNOSIS — Z3200 Encounter for pregnancy test, result unknown: Secondary | ICD-10-CM

## 2021-09-29 ENCOUNTER — Other Ambulatory Visit: Payer: Self-pay

## 2021-10-12 ENCOUNTER — Encounter: Payer: Self-pay | Admitting: Certified Nurse Midwife

## 2021-10-12 ENCOUNTER — Telehealth: Payer: Self-pay | Admitting: Certified Nurse Midwife

## 2021-10-12 NOTE — Telephone Encounter (Signed)
Pt called stating they though their apt today was 9:25- it was 9:15- pt still did not show in person- pt called to reschedule- pt stated they will be leaving town on 18th- reminded pt she has apt on 18th she requested to reschedule. I have rescheduled and requested interpreter- pt verbalized understanding of both apt dates provided.  ?

## 2021-10-12 NOTE — Progress Notes (Deleted)
Gertie Gowda presents for NOB nurse intake visit. Pregnancy confirmation done at [facility] by Doreene Burke, CNM on 09/07/21.  G.1 P.0  LMP. 07/28/21 EDD. 04/05/22 by ultrasound [place]redacted[place]w[redacted]d Pregnancy education material explained and given.  ___cats in the home.  NOB labs ordered. BMI not greater than 30. TSH/HbgA1c not ordered. Sickle cell not ordered due to race. HIV and drug screen explained and ordered. Genetic screening discussed. Genetic testing disussed. Pt to follow-up on genetic testing with provider. PNV encouraged. Pt to follow up with provider in 1 week for NOB physical.  FMLA,[facility] Financial Policy and HIV/Drug all reviewed and signed by patient.

## 2021-10-15 ENCOUNTER — Encounter: Payer: Medicaid Other | Admitting: Certified Nurse Midwife

## 2021-10-21 ENCOUNTER — Encounter: Payer: Self-pay | Admitting: Certified Nurse Midwife

## 2021-10-23 ENCOUNTER — Ambulatory Visit (INDEPENDENT_AMBULATORY_CARE_PROVIDER_SITE_OTHER): Payer: Medicaid Other

## 2021-10-23 DIAGNOSIS — Z3689 Encounter for other specified antenatal screening: Secondary | ICD-10-CM

## 2021-10-23 NOTE — Progress Notes (Signed)
New OB Intake  I connected with  Latoya Perkins on 10/23/21 at  2:15 PM EDT by telephone with the help of interpreter Brent, ID# [phone].  and verified that I am speaking with the correct person using two identifiers. Nurse is located at [facility] and pt is located at home.  I explained I am completing New OB Intake today. We discussed her EDD of 04/03/2022 that is based on LMP of 06/27/2021. Pt is G1/P0000. Pt states she has no friends where she lives. I reviewed her allergies, medications, Medical/Surgical/OB history, and appropriate screenings. Based on history, this is a/an pregnancy uncomplicated .   Patient Active Problem List   Diagnosis Date Noted   Encounter for supervision of normal first pregnancy in first trimester 09/07/2021    Concerns addressed today Pt states she does have scoliosis.  How will that affect the pregnancy.  Adv she may have more back pain but there are exercises she can do.  Delivery Plans:  Plans to deliver at [facility]   [place] Not discussed.  Labs  Patient aware genetic testing may be drawn at new OB visit. Discussed possible labs to be drawn at new OB appointment.  COVID Vaccine Patient has had COVID vaccine.   Social Determinants of Health Food Insecurity: denies food insecurity Transportation: Patient denies transportation needs.  First visit review I reviewed new OB appt with pt. I explained she will have ob bloodwork and pap smear/pelvic exam if indicated. Explained pt will be seen by Doreene Burke, CNM at first visit; encounter routed to appropriate provider.   Loran Senters, [facility] 10/23/2021  3:00 PM  Clinical Staff Provider  Office Location  [facility] Dating    Language  Spanish Anatomy US    Flu Vaccine   Genetic Screen  NIPS:   TDaP vaccine   DUE Hgb A1C or  GTT Early : Third trimester :   Covid DONE   LAB RESULTS   Rhogam   Blood Type     Feeding Plan Breast Antibody    Contraception pill Rubella    Circumcision  yes RPR     Pediatrician  undecided HBsAg     Support Person Luis HIV    Prenatal Classes yes Varicella     GBS  (For PCN allergy, check sensitivities)   BTL Consent  Hep C   VBAC Consent  Pap      Hgb Electro    Has scoliosis  CF      SMA

## 2021-10-27 ENCOUNTER — Encounter: Payer: Medicaid Other | Admitting: Certified Nurse Midwife

## 2021-11-09 ENCOUNTER — Encounter: Payer: Medicaid Other | Admitting: Certified Nurse Midwife

## 2021-11-27 ENCOUNTER — Emergency Department
Admission: EM | Admit: 2021-11-27 | Discharge: 2021-11-28 | Disposition: A | Discharge status: Home / Self Care | Payer: Medicaid Other | Attending: Emergency Medicine | Admitting: Emergency Medicine

## 2021-11-27 ENCOUNTER — Emergency Department: Payer: Medicaid Other

## 2021-11-27 DIAGNOSIS — O2341 Unspecified infection of urinary tract in pregnancy, first trimester: Secondary | ICD-10-CM | POA: Diagnosis not present

## 2021-11-27 DIAGNOSIS — O99281 Endocrine, nutritional and metabolic diseases complicating pregnancy, first trimester: Secondary | ICD-10-CM | POA: Insufficient documentation

## 2021-11-27 DIAGNOSIS — O2340 Unspecified infection of urinary tract in pregnancy, unspecified trimester: Secondary | ICD-10-CM

## 2021-11-27 DIAGNOSIS — D72829 Elevated white blood cell count, unspecified: Secondary | ICD-10-CM | POA: Diagnosis not present

## 2021-11-27 DIAGNOSIS — Z3A00 Weeks of gestation of pregnancy not specified: Secondary | ICD-10-CM | POA: Insufficient documentation

## 2021-11-27 DIAGNOSIS — F419 Anxiety disorder, unspecified: Secondary | ICD-10-CM | POA: Diagnosis not present

## 2021-11-27 DIAGNOSIS — E876 Hypokalemia: Secondary | ICD-10-CM | POA: Insufficient documentation

## 2021-11-27 DIAGNOSIS — O99341 Other mental disorders complicating pregnancy, first trimester: Secondary | ICD-10-CM | POA: Diagnosis not present

## 2021-11-27 DIAGNOSIS — F410 Panic disorder [episodic paroxysmal anxiety] without agoraphobia: Secondary | ICD-10-CM

## 2021-11-27 DIAGNOSIS — O26891 Other specified pregnancy related conditions, first trimester: Secondary | ICD-10-CM | POA: Diagnosis present

## 2021-11-27 DIAGNOSIS — O99111 Other diseases of the blood and blood-forming organs and certain disorders involving the immune mechanism complicating pregnancy, first trimester: Secondary | ICD-10-CM | POA: Insufficient documentation

## 2021-11-27 MED ORDER — LORAZEPAM 2 MG/ML IJ SOLN
1.0000 mg | Freq: Once | INTRAMUSCULAR | Status: AC
  Administered 2021-11-28: 1 mg via INTRAVENOUS
  Filled 2021-11-27: qty 1

## 2021-11-27 MED ORDER — LACTATED RINGERS IV BOLUS
1000.0000 mL | Freq: Once | INTRAVENOUS | Status: AC
  Administered 2021-11-28: 1000 mL via INTRAVENOUS

## 2021-11-27 NOTE — ED Triage Notes (Signed)
Pt is pregnant, due in November.  States she has felt "hot". No shob.  Pt is tremulous in triage.  No cough, nausea, vomiting, diarrhea.  Pt just reiterates that she is "hot"  HR noted to be 150 in triage.

## 2021-11-28 ENCOUNTER — Telehealth: Payer: Self-pay | Admitting: Emergency Medicine

## 2021-11-28 LAB — CBC WITH DIFFERENTIAL/PLATELET
Abs Immature Granulocytes: 0.1 10*3/uL — ABNORMAL HIGH (ref 0.00–0.07)
Basophils Absolute: 0.1 10*3/uL (ref 0.0–0.1)
Basophils Relative: 0 %
Eosinophils Absolute: 0.1 10*3/uL (ref 0.0–0.5)
Eosinophils Relative: 1 %
HCT: 33.2 % — ABNORMAL LOW (ref 36.0–46.0)
Hemoglobin: 10.9 g/dL — ABNORMAL LOW (ref 12.0–15.0)
Immature Granulocytes: 1 %
Lymphocytes Relative: 22 %
Lymphs Abs: 3.3 10*3/uL (ref 0.7–4.0)
MCH: 30.4 pg (ref 26.0–34.0)
MCHC: 32.8 g/dL (ref 30.0–36.0)
MCV: 92.5 fL (ref 80.0–100.0)
Monocytes Absolute: 0.9 10*3/uL (ref 0.1–1.0)
Monocytes Relative: 6 %
Neutro Abs: 10.5 10*3/uL — ABNORMAL HIGH (ref 1.7–7.7)
Neutrophils Relative %: 70 %
Platelets: 316 10*3/uL (ref 150–400)
RBC: 3.59 MIL/uL — ABNORMAL LOW (ref 3.87–5.11)
RDW: 14.4 % (ref 11.5–15.5)
WBC: 14.9 10*3/uL — ABNORMAL HIGH (ref 4.0–10.5)
nRBC: 0 % (ref 0.0–0.2)

## 2021-11-28 LAB — COMPREHENSIVE METABOLIC PANEL
ALT: 39 U/L (ref 0–44)
AST: 24 U/L (ref 15–41)
Albumin: 3.8 g/dL (ref 3.5–5.0)
Alkaline Phosphatase: 45 U/L (ref 38–126)
Anion gap: 8 (ref 5–15)
BUN: 8 mg/dL (ref 6–20)
CO2: 22 mmol/L (ref 22–32)
Calcium: 9.1 mg/dL (ref 8.9–10.3)
Chloride: 106 mmol/L (ref 98–111)
Creatinine, Ser: 0.45 mg/dL (ref 0.44–1.00)
GFR, Estimated: >60 mL/min (ref >60)
Glucose, Bld: 103 mg/dL — ABNORMAL HIGH (ref 70–99)
Potassium: 3 mmol/L — ABNORMAL LOW (ref 3.5–5.1)
Sodium: 136 mmol/L (ref 135–145)
Total Bilirubin: 0.5 mg/dL (ref 0.3–1.2)
Total Protein: 7.5 g/dL (ref 6.5–8.1)

## 2021-11-28 LAB — URINALYSIS, ROUTINE W REFLEX MICROSCOPIC
Bilirubin Urine: NEGATIVE
Glucose, UA: NEGATIVE mg/dL
Hgb urine dipstick: NEGATIVE
Ketones, ur: NEGATIVE mg/dL
Nitrite: NEGATIVE
Protein, ur: NEGATIVE mg/dL
Specific Gravity, Urine: 1.003 — ABNORMAL LOW (ref 1.005–1.030)
pH: 6 (ref 5.0–8.0)

## 2021-11-28 MED ORDER — POTASSIUM CHLORIDE CRYS ER 20 MEQ PO TBCR: 20.0000 meq | EXTENDED_RELEASE_TABLET | Freq: Every day | ORAL | 0 refills | Status: DC

## 2021-11-28 MED ORDER — POTASSIUM CHLORIDE CRYS ER 20 MEQ PO TBCR: 20.0000 meq | EXTENDED_RELEASE_TABLET | Freq: Every day | ORAL | 0 refills | Status: AC

## 2021-11-28 MED ORDER — POTASSIUM CHLORIDE 20 MEQ PO PACK
40.0000 meq | PACK | ORAL | Status: AC
  Administered 2021-11-28: 40 meq via ORAL
  Filled 2021-11-28: qty 2

## 2021-11-28 MED ORDER — POTASSIUM CHLORIDE CRYS ER 20 MEQ PO TBCR
40.0000 meq | EXTENDED_RELEASE_TABLET | Freq: Once | ORAL | Status: DC
  Filled 2021-11-28: qty 2

## 2021-11-28 MED ORDER — CEPHALEXIN 500 MG PO CAPS
500.0000 mg | ORAL_CAPSULE | Freq: Once | ORAL | Status: DC
  Filled 2021-11-28: qty 1

## 2021-11-28 MED ORDER — CEPHALEXIN 250 MG/5ML PO SUSR: 500.0000 mg | Freq: Four times a day (QID) | ORAL | 0 refills | Status: DC

## 2021-11-28 MED ORDER — CEFADROXIL 500 MG PO CAPS: 500.0000 mg | ORAL_CAPSULE | Freq: Two times a day (BID) | ORAL | 0 refills | Status: DC

## 2021-11-28 MED ORDER — LORAZEPAM 1 MG PO TABS: 1.0000 mg | ORAL_TABLET | Freq: Once | ORAL | Status: DC

## 2021-11-28 NOTE — ED Provider Notes (Addendum)
[facility] Provider Note    Event Date/Time   First MD Initiated Contact with Patient 11/27/21 2320     (approximate)   History   Tachycardia   HPI The patient and/or family speak(s) Spanish.  They understand they have the right to the use of a hospital interpreter, however at this time they prefer to speak directly with me in Spanish.  They know that they can ask for an interpreter at any time.  Latoya Perkins is a 21 y.o. female who reports she is about 3 months pregnant.  She presents tonight saying that she feels anxious, hot, and dizzy.  She said that this is happened multiple times in the past and it has been worse in the past because before she has completely lost her vision.  No one has ever told her what is happening except that she has anxiety and has had panic attacks.  She continues to tell me that she feels hot right now, better than she did before but still little bit dizzy, and that she feels like she needs air.  She denies any pain, including chest pain, abdominal pain, and pelvic pain.  She has had no vaginal bleeding.  [facility] is [facility] and has had no complications with her pregnancy.  She denies recent nausea, vomiting, and diarrhea.     Physical Exam   Triage Vital Signs: ED Triage Vitals  Enc Vitals Group     BP 11/27/21 2314 (!) 138/97     Pulse Rate 11/27/21 2314 (!) 140     Resp 11/27/21 2330 (!) 26     Temp 11/27/21 2314 98.2 F (36.8 C)     Temp Source 11/27/21 2314 Oral     SpO2 11/27/21 2314 92 %     Weight --      Height --      Head Circumference --      Peak Flow --      Pain Score --      Pain Loc --      Pain Edu? --      Excl. in GC? --     Most recent vital signs: Vitals:   11/28/21 0230 11/28/21 0241  BP: 127/76 127/79  Pulse: (!) 111 (!) 109  Resp:  (!) 25  Temp:    SpO2: 100% 100%     General: Awake, anxious but not in respiratory distress, talking with me and her companion  as well as texting on her phone but very anxious. CV:  Good peripheral perfusion.  Tachycardia with regular rhythm. Resp:  Normal effort.  Lungs are clear to auscultation bilaterally.  She has a little bit of tachypnea but is not having difficulty breathing in terms of requiring accessory muscles. Abd:  Appropriately gravid uterus for gestational age.  Small body habitus. Other:  Anxious, keeps looking nervously at the cardiac monitor, is clearly getting more anxious while talking to me and keeps asking for air and for the temperature to come down in the room.   ED Results / Procedures / Treatments   Labs (all labs ordered are listed, but only abnormal results are displayed) Labs Reviewed  CBC WITH DIFFERENTIAL/PLATELET - Abnormal; Notable for the following components:      Result Value   WBC 14.9 (*)    RBC 3.59 (*)    Hemoglobin 10.9 (*)    HCT 33.2 (*)    Neutro Abs 10.5 (*)    Abs Immature Granulocytes  0.10 (*)    All other components within normal limits  COMPREHENSIVE METABOLIC PANEL - Abnormal; Notable for the following components:   Potassium 3.0 (*)    Glucose, Bld 103 (*)    All other components within normal limits  URINALYSIS, ROUTINE W REFLEX MICROSCOPIC - Abnormal; Notable for the following components:   Color, Urine STRAW (*)    APPearance CLEAR (*)    Specific Gravity, Urine 1.003 (*)    Leukocytes,Ua MODERATE (*)    Bacteria, UA FEW (*)    All other components within normal limits  URINE CULTURE     EKG  ED ECG REPORT I, Hinda Kehr, the attending physician, personally viewed and interpreted this ECG.  Date: 11/28/2021 EKG Time: 23: 21 Rate: 148 Rhythm: Sinus tachycardia QRS Axis: normal Intervals: normal ST/T Wave abnormalities: Non-specific ST segment / T-wave changes, but no clear evidence of acute ischemia. Narrative Interpretation: no definitive evidence of acute ischemia; does not meet STEMI criteria.  ED ECG REPORT I, Hinda Kehr, the  attending physician, personally viewed and interpreted this ECG.  Date: 11/28/2021 EKG Time: 00: 11 Rate: 137 Rhythm: Sinus tachycardia QRS Axis: normal Intervals: normal ST/T Wave abnormalities: Non-specific ST segment / T-wave changes, but no clear evidence of acute ischemia. Narrative Interpretation: no definitive evidence of acute ischemia; does not meet STEMI criteria.      PROCEDURES:  Critical Care performed: No  Procedures   MEDICATIONS ORDERED IN ED: Medications  potassium chloride (KLOR-CON) packet 40 mEq (has no administration in time range)  lactated ringers bolus 1,000 mL (0 mLs Intravenous Stopped 11/28/21 0119)  LORazepam (ATIVAN) injection 1 mg (1 mg Intravenous Given 11/28/21 0015)     IMPRESSION / MDM / ASSESSMENT AND PLAN / ED COURSE  I reviewed the triage vital signs and the nursing notes.                              Differential diagnosis includes, but is not limited to, panic attack/anxiety, SVT, PE, electrolyte or metabolic abnormality.  Patient's presentation is most consistent with acute presentation with potential threat to life or bodily function.  I believe that the symptoms are most consistent with a panic attack.  She is getting more more anxious as I am talking with her and she confirmed that she has had this problem in the past.  Her friend or family member that is with her also concurred that this is something that is happened in the past and that they believe it is anxiety.  She is in no pain and has had no pregnancy complications such as vaginal bleeding.  Although I carefully considered medication administration in the setting of second trimester pregnancy, I believe it is and both the patient and the fetus's best interest to control her symptoms.  I ordered Ativan 1 mg IV as well as LR 1 L IV bolus.  Labs ordered include comprehensive metabolic panel, CBC with differential, urinalysis given that she is pregnant.  Originally in triage they  ordered a beta-hCG, but I feel that it would be of limited utility since she is clearly pregnant, follows OB/GYN for prenatal care, and has no directly pregnancy related complications at this time.  Also by 3 months the beta-hCG is of limited utility regardless.  Also ordered EKG.  The patient is on the cardiac monitor to evaluate for evidence of arrhythmia and/or significant heart rate changes.  Clinical Course as of 11/28/21  9924  Sat Nov 28, 2021  0149 Patient's heart rate has improved somewhat to about 120 but she is still tachycardic.  Work-up includes a mild leukocytosis which is not unexpected given her pregnancy.  CMP is essentially normal other than hypokalemia and I ordered 40 mill equivalents of potassium to start replating it. [CF]  0150 Urinalysis shows moderate leukocytes with few bacteria.  Given that she is pregnant, I ordered a urine culture and plan to treat empirically.  I will reassess the patient to see if she is feeling better after the Ativan. [CF]  0150 Treating empirically with Keflex 500 mg by mouth but will plan to discharge on cefadroxil [CF]  0234 The patient's heart rate has come down to about 105 on the monitor.  This is likely her baseline given her small body habitus.  I also looked back through a prior record in 2 months ago when she was in the emergency department her heart rate was about 118.  I walked in the room to check on her and as soon as I walked in the room her heart rate jumped up to the mid 140s.  She said that she does not feel much different or better.  We talked again about anxiety.  She continues to have no chest pain and no shortness of breath.  While PE is a possibility, I find it very unlikely given that her only risk factors pregnancy, she has no hypoxemia, no shortness of breath, no chest pain.  I feel that it would be more dangerous for her baby to get the radiation of going through his CTA chest given these lack of other symptoms.  She is quite  clearly anxious and stressed.  I am looking at the monitor right now and her heart rate is 100 now that I have left.  She is comfortable, speaking with her family member, and texting on her phone.  I provided reassurance and will give her another small dose of oral Ativan to help, but I strongly encouraged her to follow-up as an outpatient with her primary provider or her OB/GYN.  She says that she understands and agrees with the plan.  I also explained that I am treating her empirically for possible urinary tract infection and giving her prescription for potassium.  I gave my usual and customary return precautions. [CF]  0242 HR now down to 97 [CF]  0310 changed meds because patient cannot swallow pills - switched to keflex solution Rx. [CF]    Clinical Course User Index [CF] Loleta Rose, MD     FINAL CLINICAL IMPRESSION(S) / ED DIAGNOSES   Final diagnoses:  Anxiety attack  Hypokalemia  Urinary tract infection in mother during pregnancy, antepartum     Rx / DC Orders   ED Discharge Orders          Ordered    potassium chloride SA (KLOR-CON M20) 20 MEQ tablet  Daily        11/28/21 0242    cefadroxil (DURICEF) 500 MG capsule  2 times daily,   Status:  Discontinued        11/28/21 0242    cephALEXin (KEFLEX) 250 MG/5ML suspension  4 times daily        11/28/21 0309             Note:  This document was prepared using Dragon voice recognition software and may include unintentional dictation errors.   Loleta Rose, MD 11/28/21 2683    Loleta Rose, MD 11/28/21  0312  

## 2021-11-28 NOTE — ED Provider Notes (Incomplete)
[facility] Provider Note    Event Date/Time   First MD Initiated Contact with Patient 11/27/21 2320     (approximate)   History   Tachycardia   HPI The patient and/or family speak(s) Spanish.  They understand they have the right to the use of a hospital interpreter, however at this time they prefer to speak directly with me in Spanish.  They know that they can ask for an interpreter at any time.  Latoya Perkins is a 21 y.o. female who reports she is about 3 months pregnant.  She presents tonight saying that she feels anxious, hot, and dizzy.  She said that this is happened multiple times in the past and it has been worse in the past because before she has completely lost her vision.  No one has ever told her what is happening except that she has anxiety and has had panic attacks.  She continues to tell me that she feels hot right now, better than she did before but still little bit dizzy, and that she feels like she needs air.  She denies any pain, including chest pain, abdominal pain, and pelvic pain.  She has had no vaginal bleeding.  [facility] is followed [facility] and has had no complications with her pregnancy.  She denies recent nausea, vomiting, and diarrhea.     Physical Exam   Triage Vital Signs: ED Triage Vitals  Enc Vitals Group     BP 11/27/21 2314 (!) 138/97     Pulse Rate 11/27/21 2314 (!) 140     Resp 11/27/21 2330 (!) 26     Temp 11/27/21 2314 98.2 F (36.8 C)     Temp Source 11/27/21 2314 Oral     SpO2 11/27/21 2314 92 %     Weight --      Height --      Head Circumference --      Peak Flow --      Pain Score --      Pain Loc --      Pain Edu? --      Excl. in GC? --     Most recent vital signs: Vitals:   11/27/21 2314 11/27/21 2330  BP: (!) 138/97 118/68  Pulse: (!) 140 (!) 120  Resp:  (!) 26  Temp: 98.2 F (36.8 C)   SpO2: 92% 100%     General: Awake, anxious but not in respiratory distress, talking with  me and her companion as well as texting on her phone but very anxious. CV:  Good peripheral perfusion.  Tachycardia with regular rhythm. Resp:  Normal effort.  Lungs are clear to auscultation bilaterally.  She has a little bit of tachypnea but is not having difficulty breathing in terms of requiring accessory muscles. Abd:  Appropriately gravid uterus for gestational age.  Small body habitus. Other:  Anxious, keeps looking nervously at the cardiac monitor, is clearly getting more anxious while talking to me and keeps asking for air and for the temperature to come down in the room.   ED Results / Procedures / Treatments   Labs (all labs ordered are listed, but only abnormal results are displayed) Labs Reviewed  CBC WITH DIFFERENTIAL/PLATELET  COMPREHENSIVE METABOLIC PANEL  HCG, QUANTITATIVE, PREGNANCY  URINALYSIS, ROUTINE W REFLEX MICROSCOPIC     EKG  ***   RADIOLOGY *** {USE THE WORD "INTERPRETED"!! You MUST document your own interpretation of imaging, as well as the fact that you reviewed the  radiologist's report!:1}   PROCEDURES:  Critical Care performed: {CriticalCareYesNo:19197::"Yes, see critical care procedure note(s)","No"}  Procedures   MEDICATIONS ORDERED IN ED: Medications  lactated ringers bolus 1,000 mL (has no administration in time range)  LORazepam (ATIVAN) injection 1 mg (has no administration in time range)     IMPRESSION / MDM / ASSESSMENT AND PLAN / ED COURSE  I reviewed the triage vital signs and the nursing notes.                              Differential diagnosis includes, but is not limited to, ***  Patient's presentation is most consistent with {EM COPA:27473}  {If the patient is on the monitor, remove the brackets and asterisks on the sentence below and remember to document it as a Procedure as well. Otherwise delete the sentence below:1} {**The patient is on the cardiac monitor to evaluate for evidence of arrhythmia and/or significant  heart rate changes.**} {Remember to include, when applicable, any/all of the following data: independent review of imaging independent review of labs (comment specifically on pertinent positives and negatives) review of specific prior hospitalizations, PCP/specialist notes, etc. discuss meds given and prescribed document any discussion with consultants (including hospitalists) any clinical decision tools you used and why (PECARN, NEXUS, etc.) did you consider admitting the patient? document social determinants of health affecting patient's care (homelessness, inability to follow up in a timely fashion, etc) document any pre-existing conditions increasing risk on current visit (e.g. diabetes and HTN increasing danger of high-risk chest pain/ACS) describes what meds you gave (especially parenteral) and why any other interventions?:1}     FINAL CLINICAL IMPRESSION(S) / ED DIAGNOSES   Final diagnoses:  None     Rx / DC Orders   ED Discharge Orders     None        Note:  This document was prepared using Dragon voice recognition software and may include unintentional dictation errors.

## 2021-11-28 NOTE — Telephone Encounter (Signed)
pt did not recieve printed Rx, sent to pharmacy electronically

## 2021-11-29 LAB — URINE CULTURE

## 2021-12-03 ENCOUNTER — Ambulatory Visit (INDEPENDENT_AMBULATORY_CARE_PROVIDER_SITE_OTHER): Payer: Medicaid Other | Admitting: Certified Nurse Midwife

## 2021-12-03 ENCOUNTER — Encounter: Payer: Medicaid Other | Admitting: Certified Nurse Midwife

## 2021-12-03 ENCOUNTER — Encounter: Payer: Self-pay | Admitting: Certified Nurse Midwife

## 2021-12-03 VITALS — BP 130/74 | HR 105 | Wt 95.7 lb

## 2021-12-03 DIAGNOSIS — Z124 Encounter for screening for malignant neoplasm of cervix: Secondary | ICD-10-CM

## 2021-12-03 DIAGNOSIS — Z1379 Encounter for other screening for genetic and chromosomal anomalies: Secondary | ICD-10-CM

## 2021-12-03 DIAGNOSIS — Z3482 Encounter for supervision of other normal pregnancy, second trimester: Secondary | ICD-10-CM

## 2021-12-03 DIAGNOSIS — Z3A22 22 weeks gestation of pregnancy: Secondary | ICD-10-CM

## 2021-12-03 NOTE — Patient Instructions (Signed)
Dolor del ligamento redondo Round Ligament Pain  Los ligamentos redondos son un par de tejidos que se asemejan a un cordn que ayudan a Dietitian. Pueden convertirse en una fuente de dolor durante el embarazo a medida que los ligamentos se ablandan y se estiran con el crecimiento del beb. El dolor suele comenzar en el segundo trimestre (de 13 a 28 semanas) de [place], y solo debe durar unos segundos cuando se produce. Sin embargo, Chief Technology Officer puede ir y Tax adviser que el nacimiento del beb. El dolor no le causa dao al beb. El dolor del ligamento redondo suele ser agudo y punzante, y durar poco tiempo, pero tambin puede ser sordo, persistente y continuo. Se lo percibe en la regin inferior del abdomen o en la ingle. A menudo comienza en la zona ms profunda de la ingle y se extiende hacia regin externa de la cadera. El dolor puede producirse cuando usted: Cambia sbitamente de posicin, como pasar rpidamente de estar sentada a ponerse de pie. Hace actividad fsica. Tose o estornuda. Siga estas instrucciones en su casa: Control del dolor  Cuando el dolor comience, reljese. Luego pruebe cualquiera de estos mtodos para Acupuncturist dolor: Sintese. Flexione las rodillas hacia el abdomen. Acustese de costado con una almohada debajo del abdomen y [employer] las piernas. Sintese en una baera con agua tibia durante 15 a 20 minutos o hasta que el dolor desaparezca. Instrucciones generales Controle su afeccin para detectar cualquier cambio. Muvase lentamente cuando se siente o se ponga de pie. Suspenda o reduzca las actividades fsicas si Public relations account executive. No haga caminatas largas si le generan dolor. Use los medicamentos de venta libre y los recetados solamente como se lo haya indicado el mdico. Concurra a todas las visitas de seguimiento. Esto es importante. Comunquese con un mdico si: El dolor no desaparece con Scientist, research (medical). Tiene un dolor en la espalda que no tena  antes. El medicamento no resulta eficaz. Tiene fiebre o escalofros. Tiene nuseas o vmitos. Tiene diarrea. Siente dolor al [employer]. Solicite ayuda de inmediato si: Siente un dolor de espalda que es rtmico y de tipo clico, similar a las contracciones del [place]. Las contracciones del parto suelen aparecer cada 2 minutos, tienen una duracin de aproximadamente 1 minuto, e implican una sensacin de empujar o presin en la pelvis. Tiene una hemorragia vaginal abundante. Estos sntomas pueden representar un problema grave que constituye Radio broadcast assistant. No espere a ver si los sntomas desaparecen. Solicite atencin mdica de inmediato. Comunquese con el servicio de emergencias de su localidad (911 en los [place]). No conduzca por sus propios medios [employer]. Resumen El dolor del ligamento redondo se siente en la parte inferior del abdomen o la ingle. El dolor suele comenzar en el segundo trimestre (de 13 a 28 semanas) de [place], y solo debe durar unos segundos cuando se produce. Usted puede notar el dolor cuando cambia sbitamente de posicin, cuando tose o estornuda, o durante la actividad fsica. Relajarse, flexionar las rodillas hacia el abdomen, acostarse sobre un lado o tomar un bao de agua tibia pueden ayudar a Engineer, site. Comunquese con su mdico si el dolor no desaparece. Esta informacin no tiene Theme park manager el consejo del mdico. Asegrese de hacerle al mdico cualquier pregunta que tenga. Document Revised: 08/19/2020 Document Reviewed: 08/19/2020 Elsevier Patient Education  2023 [employer].

## 2021-12-03 NOTE — Progress Notes (Signed)
NEW OB HISTORY AND PHYSICAL  SUBJECTIVE:       Bobette Leyh is a 21 y.o. G1P0 female, Patient's last menstrual period was 06/27/2021 (exact date)., Estimated Date of Delivery: 04/05/22, [redacted]w[redacted]d, presents today for establishment of Prenatal Care. She has no unusual complaints. States she recently went to ED for heart racing was found that potassium was low. Now on oral potassium. NOB labs and CMP today.   Relationship: boyfriend /Father of baby Living: with her boyfriend Work:  none Exerci:se none Alcohol:  denies Drugs: denies  Smoking/Vaping:  denies   Gynecologic History Patient's last menstrual period was 06/27/2021 (exact date). Normal Contraception: none Last Pap: has not had.   Obstetric History OB History  Gravida Para Term Preterm AB Living  1            SAB IAB Ectopic Multiple Live Births               # Outcome Date GA Lbr Len/2nd Weight Sex Delivery Anes PTL Lv  1 Current             No past medical history on file.  No past surgical history on file.  Current Outpatient Medications on File Prior to Visit  Medication Sig Dispense Refill   cephALEXin (KEFLEX) 250 MG/5ML suspension Take 10 mLs (500 mg total) by mouth 4 (four) times daily for 5 days. 200 mL 0   potassium chloride SA (KLOR-CON M20) 20 MEQ tablet Take 1 tablet (20 mEq total) by mouth daily. 7 tablet 0   Prenatal Vit-Min-FA-Fish Oil ([facility] PRENATAL GUMMY) 0.4-113.5 MG CHEW Chew 1 each by mouth daily. 84 tablet 3   No current facility-administered medications on file prior to visit.    No Known Allergies  Social History   Socioeconomic History   Marital status: Single    Spouse name: Not on file   Number of children: 0   Years of education: 13   Highest education level: Not on file  Occupational History   Occupation: unemployed  Tobacco Use   Smoking status: Never   Smokeless tobacco: Never  Vaping Use   Vaping Use: Never used  Substance and Sexual Activity   Alcohol use: Not  Currently   Drug use: Never   Sexual activity: Yes    Birth control/protection: None  Other Topics Concern   Not on file  Social History Narrative   Not on file   Social Determinants of Health   Financial Resource Strain: Low Risk  (10/23/2021)   Overall Financial Resource Strain (CARDIA)    Difficulty of Paying Living Expenses: Not very hard  Food Insecurity: No Food Insecurity (10/23/2021)   Hunger Vital Sign    Worried About Running Out of Food in the Last Year: Never true    Ran Out of Food in the Last Year: Never true  Transportation Needs: No Transportation Needs (10/23/2021)   PRAPARE - Administrator, Civil Service (Medical): No    Lack of Transportation (Non-Medical): No  Physical Activity: Insufficiently Active (10/23/2021)   Exercise Vital Sign    Days of Exercise per Week: 3 days    Minutes of Exercise per Session: 30 min  Stress: No Stress Concern Present (10/23/2021)   [employer] of Occupational Health - Occupational Stress Questionnaire    Feeling of Stress : Not at all  Social Connections: Moderately Integrated (10/23/2021)   Social Connection and Isolation Panel [NHANES]    Frequency of Communication with Friends and Family: More  than three times a week    Frequency of Social Gatherings with Friends and Family: Never    Attends Religious Services: More than 4 times per year    Active Member of [employer] or Organizations: No    Attends Banker Meetings: Never    Marital Status: Living with partner  Intimate Partner Violence: Not At Risk (10/23/2021)   Humiliation, Afraid, Rape, and Kick questionnaire    Fear of Current or Ex-Partner: No    Emotionally Abused: No    Physically Abused: No    Sexually Abused: No    No family history on file.  The following portions of the patient's history were reviewed and updated as appropriate: allergies, current medications, past OB history, past medical history, past surgical history, past family  history, past social history, and problem list.    OBJECTIVE: Initial Physical Exam (New OB)  GENERAL APPEARANCE: alert, well appearing, in no apparent distress, oriented to person, place and time HEAD: normocephalic, atraumatic MOUTH: mucous membranes moist, pharynx normal without lesions THYROID: no thyromegaly or masses present BREASTS: no masses noted, no significant tenderness, no palpable axillary nodes, no skin changes LUNGS: clear to auscultation, no wheezes, rales or rhonchi, symmetric air entry HEART: regular rate and rhythm, no murmurs ABDOMEN: soft, nontender, nondistended, no abnormal masses, no epigastric pain EXTREMITIES: no redness or tenderness in the calves or thighs SKIN: normal coloration and turgor, no rashes LYMPH NODES: no adenopathy palpable NEUROLOGIC: alert, oriented, normal speech, no focal findings or movement disorder noted  PELVIC EXAM EXTERNAL GENITALIA: normal appearing vulva with no masses, tenderness or lesions VAGINA: no abnormal discharge or lesions CERVIX: no lesions or cervical motion tenderness UTERUS: gravid ADNEXA: no masses palpable and nontender OB EXAM PELVIMETRY: appears adequate RECTUM: exam not indicated  ASSESSMENT: Normal pregnancy  PLAN: Prenatal care See orders New OB counseling: The patient has been given an overview regarding routine prenatal care. Recommendations regarding diet, weight gain, and exercise in pregnancy were given. Prenatal testing, optional genetic testing, carrier screening, and ultrasound use in pregnancy were reviewed. Maternit 21 today. Benefits of Breast Feeding were discussed. The patient is encouraged to consider nursing her baby post partum. Plans bottle feeding.   Doreene Burke, CNM

## 2021-12-04 LAB — URINALYSIS, ROUTINE W REFLEX MICROSCOPIC
Bilirubin, UA: NEGATIVE
Glucose, UA: NEGATIVE
Ketones, UA: NEGATIVE
Leukocytes,UA: NEGATIVE
Nitrite, UA: NEGATIVE
Protein,UA: NEGATIVE
RBC, UA: NEGATIVE
Specific Gravity, UA: 1.018 (ref 1.005–1.030)
Urobilinogen, Ur: 0.2 mg/dL (ref 0.2–1.0)
pH, UA: 6 (ref 5.0–7.5)

## 2021-12-04 LAB — CBC
Hematocrit: 30.1 % — ABNORMAL LOW (ref 34.0–46.6)
Hemoglobin: 10.4 g/dL — ABNORMAL LOW (ref 11.1–15.9)
MCH: 30.6 pg (ref 26.6–33.0)
MCHC: 34.6 g/dL (ref 31.5–35.7)
MCV: 89 fL (ref 79–97)
Platelets: 300 10*3/uL (ref 150–450)
RBC: 3.4 x10E6/uL — ABNORMAL LOW (ref 3.77–5.28)
RDW: 13.1 % (ref 11.7–15.4)
WBC: 12.2 10*3/uL — ABNORMAL HIGH (ref 3.4–10.8)

## 2021-12-04 LAB — COMPREHENSIVE METABOLIC PANEL
ALT: 21 IU/L (ref 0–32)
AST: 16 IU/L (ref 0–40)
Albumin/Globulin Ratio: 1.5 (ref 1.2–2.2)
Albumin: 4.1 g/dL (ref 3.9–5.0)
Alkaline Phosphatase: 54 IU/L (ref 44–121)
BUN/Creatinine Ratio: 24 — ABNORMAL HIGH (ref 9–23)
BUN: 10 mg/dL (ref 6–20)
Bilirubin Total: 0.3 mg/dL (ref 0.0–1.2)
CO2: 20 mmol/L (ref 20–29)
Calcium: 9.3 mg/dL (ref 8.7–10.2)
Chloride: 103 mmol/L (ref 96–106)
Creatinine, Ser: 0.42 mg/dL — ABNORMAL LOW (ref 0.57–1.00)
Globulin, Total: 2.7 g/dL (ref 1.5–4.5)
Glucose: 63 mg/dL — ABNORMAL LOW (ref 70–99)
Potassium: 4.2 mmol/L (ref 3.5–5.2)
Sodium: 138 mmol/L (ref 134–144)
Total Protein: 6.8 g/dL (ref 6.0–8.5)
eGFR: 143 mL/min/{1.73_m2} (ref >59)

## 2021-12-04 LAB — TOXOPLASMA ANTIBODIES- IGG AND  IGM
Toxoplasma Antibody- IgM: <3 AU/mL (ref 0.0–7.9)
Toxoplasma IgG Ratio: <3 IU/mL (ref 0.0–7.1)

## 2021-12-04 LAB — VIRAL HEPATITIS HBV, HCV
HCV Ab: NONREACTIVE
Hep B Core Total Ab: NEGATIVE
Hep B Surface Ab, Qual: NONREACTIVE
Hepatitis B Surface Ag: NEGATIVE

## 2021-12-04 LAB — RPR: RPR Ser Ql: NONREACTIVE

## 2021-12-04 LAB — ANTIBODY SCREEN: Antibody Screen: NEGATIVE

## 2021-12-04 LAB — ABO AND RH: Rh Factor: POSITIVE

## 2021-12-04 LAB — HIV ANTIBODY (ROUTINE TESTING W REFLEX): HIV Screen 4th Generation wRfx: NONREACTIVE

## 2021-12-04 LAB — RUBELLA SCREEN: Rubella Antibodies, IGG: 3.37 index (ref >0.99)

## 2021-12-04 LAB — HCV INTERPRETATION

## 2021-12-04 LAB — VARICELLA ZOSTER ANTIBODY, IGG: Varicella zoster IgG: <135 index — ABNORMAL LOW (ref >165)

## 2021-12-05 LAB — MONITOR DRUG PROFILE 14(MW)
Amphetamine Scrn, Ur: NEGATIVE ng/mL
BARBITURATE SCREEN URINE: NEGATIVE ng/mL
BENZODIAZEPINE SCREEN, URINE: NEGATIVE ng/mL
Buprenorphine, Urine: NEGATIVE ng/mL
CANNABINOIDS UR QL SCN: NEGATIVE ng/mL
Cocaine (Metab) Scrn, Ur: NEGATIVE ng/mL
Creatinine(Crt), U: 107.5 mg/dL (ref 20.0–300.0)
Fentanyl, Urine: NEGATIVE pg/mL
Meperidine Screen, Urine: NEGATIVE ng/mL
Methadone Screen, Urine: NEGATIVE ng/mL
OXYCODONE+OXYMORPHONE UR QL SCN: NEGATIVE ng/mL
Opiate Scrn, Ur: NEGATIVE ng/mL
Ph of Urine: 5.4 (ref 4.5–8.9)
Phencyclidine Qn, Ur: NEGATIVE ng/mL
Propoxyphene Scrn, Ur: NEGATIVE ng/mL
SPECIFIC GRAVITY: 1.016
Tramadol Screen, Urine: NEGATIVE ng/mL

## 2021-12-05 LAB — CULTURE, OB URINE

## 2021-12-05 LAB — NICOTINE SCREEN, URINE: Cotinine Ql Scrn, Ur: NEGATIVE ng/mL

## 2021-12-05 LAB — URINE CULTURE, OB REFLEX: Organism ID, Bacteria: NO GROWTH

## 2021-12-07 LAB — MATERNIT21  PLUS CORE+ESS+SCA, BLOOD

## 2021-12-07 LAB — GC/CHLAMYDIA PROBE AMP
Chlamydia trachomatis, NAA: NEGATIVE
Neisseria Gonorrhoeae by PCR: NEGATIVE

## 2021-12-08 ENCOUNTER — Other Ambulatory Visit: Payer: Self-pay | Admitting: Certified Nurse Midwife

## 2021-12-08 ENCOUNTER — Encounter: Payer: Self-pay | Admitting: Certified Nurse Midwife

## 2021-12-08 MED ORDER — FUSION PLUS PO CAPS: 1.0000 | ORAL_CAPSULE | Freq: Every day | ORAL | 11 refills | Status: AC | PRN

## 2021-12-09 ENCOUNTER — Ambulatory Visit
Admission: RE | Admit: 2021-12-09 | Discharge: 2021-12-09 | Disposition: A | Discharge status: Home / Self Care | Payer: Medicaid Other | Source: Ambulatory Visit | Attending: Certified Nurse Midwife | Admitting: Certified Nurse Midwife

## 2021-12-09 DIAGNOSIS — O26892 Other specified pregnancy related conditions, second trimester: Secondary | ICD-10-CM | POA: Diagnosis not present

## 2021-12-09 DIAGNOSIS — Z3A22 22 weeks gestation of pregnancy: Secondary | ICD-10-CM | POA: Diagnosis present

## 2021-12-16 ENCOUNTER — Observation Stay
Admission: EM | Admit: 2021-12-16 | Discharge: 2021-12-16 | Disposition: A | Discharge status: Home / Self Care | Payer: Medicaid Other | Attending: Advanced Practice Midwife | Admitting: Advanced Practice Midwife

## 2021-12-16 ENCOUNTER — Encounter: Payer: Self-pay | Admitting: Certified Nurse Midwife

## 2021-12-16 ENCOUNTER — Emergency Department: Payer: Medicaid Other

## 2021-12-16 ENCOUNTER — Emergency Department
Admission: EM | Admit: 2021-12-16 | Discharge: 2021-12-17 | Disposition: A | Discharge status: Home / Self Care | Payer: Medicaid Other | Attending: Emergency Medicine | Admitting: Emergency Medicine

## 2021-12-16 ENCOUNTER — Other Ambulatory Visit: Payer: Self-pay

## 2021-12-16 DIAGNOSIS — R110 Nausea: Secondary | ICD-10-CM | POA: Diagnosis not present

## 2021-12-16 DIAGNOSIS — R519 Headache, unspecified: Secondary | ICD-10-CM | POA: Insufficient documentation

## 2021-12-16 DIAGNOSIS — Z3A24 24 weeks gestation of pregnancy: Secondary | ICD-10-CM | POA: Insufficient documentation

## 2021-12-16 DIAGNOSIS — O368120 Decreased fetal movements, second trimester, not applicable or unspecified: Principal | ICD-10-CM | POA: Insufficient documentation

## 2021-12-16 DIAGNOSIS — O99012 Anemia complicating pregnancy, second trimester: Secondary | ICD-10-CM | POA: Insufficient documentation

## 2021-12-16 DIAGNOSIS — R079 Chest pain, unspecified: Secondary | ICD-10-CM | POA: Diagnosis not present

## 2021-12-16 DIAGNOSIS — O26892 Other specified pregnancy related conditions, second trimester: Secondary | ICD-10-CM | POA: Insufficient documentation

## 2021-12-16 DIAGNOSIS — R42 Dizziness and giddiness: Secondary | ICD-10-CM | POA: Diagnosis not present

## 2021-12-16 DIAGNOSIS — R0789 Other chest pain: Secondary | ICD-10-CM | POA: Insufficient documentation

## 2021-12-16 DIAGNOSIS — D72829 Elevated white blood cell count, unspecified: Secondary | ICD-10-CM | POA: Diagnosis not present

## 2021-12-16 DIAGNOSIS — O99112 Other diseases of the blood and blood-forming organs and certain disorders involving the immune mechanism complicating pregnancy, second trimester: Secondary | ICD-10-CM | POA: Diagnosis not present

## 2021-12-16 DIAGNOSIS — R8289 Other abnormal findings on cytological and histological examination of urine: Secondary | ICD-10-CM | POA: Diagnosis not present

## 2021-12-16 LAB — COOXEMETRY PANEL
Carboxyhemoglobin: <0.3 % — ABNORMAL LOW (ref 0.5–1.5)
Methemoglobin: <0.7 % (ref 0.0–1.5)
O2 Saturation: 32 %
Total hemoglobin: 11 g/dL — ABNORMAL LOW (ref 12.0–16.0)
Total oxygen content: 31.9 %

## 2021-12-16 LAB — TROPONIN I (HIGH SENSITIVITY): Troponin I (High Sensitivity): 3 ng/L (ref <18)

## 2021-12-16 LAB — COMPREHENSIVE METABOLIC PANEL
ALT: 18 U/L (ref 0–44)
AST: 16 U/L (ref 15–41)
Albumin: 3.4 g/dL — ABNORMAL LOW (ref 3.5–5.0)
Alkaline Phosphatase: 43 U/L (ref 38–126)
Anion gap: 4 — ABNORMAL LOW (ref 5–15)
BUN: 9 mg/dL (ref 6–20)
CO2: 22 mmol/L (ref 22–32)
Calcium: 8.6 mg/dL — ABNORMAL LOW (ref 8.9–10.3)
Chloride: 110 mmol/L (ref 98–111)
Creatinine, Ser: 0.4 mg/dL — ABNORMAL LOW (ref 0.44–1.00)
GFR, Estimated: >60 mL/min (ref >60)
Glucose, Bld: 86 mg/dL (ref 70–99)
Potassium: 3.4 mmol/L — ABNORMAL LOW (ref 3.5–5.1)
Sodium: 136 mmol/L (ref 135–145)
Total Bilirubin: 0.6 mg/dL (ref 0.3–1.2)
Total Protein: 6.9 g/dL (ref 6.5–8.1)

## 2021-12-16 LAB — CBC WITH DIFFERENTIAL/PLATELET
Abs Immature Granulocytes: 0.04 10*3/uL (ref 0.00–0.07)
Basophils Absolute: 0 10*3/uL (ref 0.0–0.1)
Basophils Relative: 0 %
Eosinophils Absolute: 0.1 10*3/uL (ref 0.0–0.5)
Eosinophils Relative: 1 %
HCT: 31.5 % — ABNORMAL LOW (ref 36.0–46.0)
Hemoglobin: 10.2 g/dL — ABNORMAL LOW (ref 12.0–15.0)
Immature Granulocytes: 0 %
Lymphocytes Relative: 20 %
Lymphs Abs: 2.2 10*3/uL (ref 0.7–4.0)
MCH: 30.1 pg (ref 26.0–34.0)
MCHC: 32.4 g/dL (ref 30.0–36.0)
MCV: 92.9 fL (ref 80.0–100.0)
Monocytes Absolute: 0.5 10*3/uL (ref 0.1–1.0)
Monocytes Relative: 4 %
Neutro Abs: 8.2 10*3/uL — ABNORMAL HIGH (ref 1.7–7.7)
Neutrophils Relative %: 75 %
Platelets: 290 10*3/uL (ref 150–400)
RBC: 3.39 MIL/uL — ABNORMAL LOW (ref 3.87–5.11)
RDW: 13.8 % (ref 11.5–15.5)
WBC: 11 10*3/uL — ABNORMAL HIGH (ref 4.0–10.5)
nRBC: 0 % (ref 0.0–0.2)

## 2021-12-16 LAB — URINALYSIS, ROUTINE W REFLEX MICROSCOPIC
Bilirubin Urine: NEGATIVE
Glucose, UA: NEGATIVE mg/dL
Hgb urine dipstick: NEGATIVE
Ketones, ur: 20 mg/dL — AB
Nitrite: NEGATIVE
Protein, ur: NEGATIVE mg/dL
Specific Gravity, Urine: 1.015 (ref 1.005–1.030)
pH: 6 (ref 5.0–8.0)

## 2021-12-16 LAB — BLOOD GAS, VENOUS
Acid-base deficit: 0.2 mmol/L (ref 0.0–2.0)
Bicarbonate: 24.8 mmol/L (ref 20.0–28.0)
O2 Saturation: 30.2 %
Patient temperature: 37
pCO2, Ven: 41 mmHg — ABNORMAL LOW (ref 44–60)
pH, Ven: 7.39 (ref 7.25–7.43)
pO2, Ven: <31 mmHg — CL (ref 32–45)

## 2021-12-16 LAB — HCG, QUANTITATIVE, PREGNANCY: hCG, Beta Chain, Quant, S: 6795 m[IU]/mL — ABNORMAL HIGH (ref <5)

## 2021-12-16 LAB — LACTIC ACID, PLASMA: Lactic Acid, Venous: 0.7 mmol/L (ref 0.5–1.9)

## 2021-12-16 NOTE — OB Triage Note (Signed)
Latoya Perkins 21 y.o. presents to Labor & Delivery triage via wheelchair steered by ED staff reporting chemical. She is a G1P0 at [redacted]w[redacted]d . She denies signs and symptoms consistent with rupture of membranes or active vaginal bleeding. She denies contractions and states positive fetal movement. External FM and TOCO applied to non-tender abdomen.  Vital signs obtained and within normal limits. Patient oriented to care environment including call bell and bed control use. Macon Large, CNM notified of patient's arrival. Plan to discharge to ED for further evaluation.

## 2021-12-16 NOTE — ED Triage Notes (Signed)
Pt presents to ER c/o headache, and dizziness since work today.  Pt states she started new job today, and states she was exposed to a strong smell of gas today. Pt is c/o chest pain, and headache since being exposed but states it has been getting progressively less painful.  Pt states she is [redacted] weeks pregnant, and has been evaluated and cleared by OB/GYN.  Pt is A&O x4 at this time in NAD in triage.

## 2021-12-16 NOTE — Discharge Summary (Signed)
Physician Final Progress Note  Patient ID: Latoya Perkins MRN: [id number] DOB/AGE: 2000-10-23 21 y.o.  Admit date: 12/16/2021 Admitting provider: Hildred Laser, MD Discharge date: 12/16/2021   Admission Diagnoses:  1) intrauterine pregnancy at [redacted]w[redacted]d  2) decreased fetal movement  Discharge Diagnoses:  Principal Problem:   Labor and delivery, indication for care Active Problems:   [redacted] weeks gestation of pregnancy  Reactive NST  History of Present Illness: The patient is a 21 y.o. female G1P0 at [redacted]w[redacted]d who presents for headache, dizziness, and chest pain. She also mentioned decreased fetal movement since this morning. She was sent from the ER for obstetric evaluation prior to further evaluation for her other symptoms.   She was admitted for observation and placed on monitors. She reports fetal movement while in triage. Monitoring is reassuring. She denies contractions, leakage of fluid or vaginal bleeding.  No past medical history on file.  No past surgical history on file.  No current facility-administered medications on file prior to encounter.   Current Outpatient Medications on File Prior to Encounter  Medication Sig Dispense Refill   Iron-FA-B Cmp-C-Biot-Probiotic (FUSION PLUS) CAPS Take 1 tablet by mouth daily as needed. 30 capsule 11   potassium chloride SA (KLOR-CON M20) 20 MEQ tablet Take 1 tablet (20 mEq total) by mouth daily. 7 tablet 0   Prenatal Vit-Min-FA-Fish Oil ([facility] PRENATAL GUMMY) 0.4-113.5 MG CHEW Chew 1 each by mouth daily. 84 tablet 3    No Known Allergies  Social History   Socioeconomic History   Marital status: Single    Spouse name: Not on file   Number of children: 0   Years of education: 13   Highest education level: Not on file  Occupational History   Occupation: unemployed  Tobacco Use   Smoking status: Never   Smokeless tobacco: Never  Vaping Use   Vaping Use: Never used  Substance and Sexual Activity   Alcohol use: Not Currently    Drug use: Never   Sexual activity: Yes    Birth control/protection: None  Other Topics Concern   Not on file  Social History Narrative   Not on file   Social Determinants of Health   Financial Resource Strain: Low Risk  (10/23/2021)   Overall Financial Resource Strain (CARDIA)    Difficulty of Paying Living Expenses: Not very hard  Food Insecurity: No Food Insecurity (10/23/2021)   Hunger Vital Sign    Worried About Running Out of Food in the Last Year: Never true    Ran Out of Food in the Last Year: Never true  Transportation Needs: No Transportation Needs (10/23/2021)   PRAPARE - Administrator, Civil Service (Medical): No    Lack of Transportation (Non-Medical): No  Physical Activity: Insufficiently Active (10/23/2021)   Exercise Vital Sign    Days of Exercise per Week: 3 days    Minutes of Exercise per Session: 30 min  Stress: No Stress Concern Present (10/23/2021)   [employer] of Occupational Health - Occupational Stress Questionnaire    Feeling of Stress : Not at all  Social Connections: Moderately Integrated (10/23/2021)   Social Connection and Isolation Panel [NHANES]    Frequency of Communication with Friends and Family: More than three times a week    Frequency of Social Gatherings with Friends and Family: Never    Attends Religious Services: More than 4 times per year    Active Member of [employer] or Organizations: No    Attends Banker Meetings:  Never    Marital Status: Living with partner  Intimate Partner Violence: Not At Risk (10/23/2021)   Humiliation, Afraid, Rape, and Kick questionnaire    Fear of Current or Ex-Partner: No    Emotionally Abused: No    Physically Abused: No    Sexually Abused: No    No family history on file.   Review of Systems  Constitutional:  Negative for chills and fever.  HENT:  Negative for congestion, ear discharge, ear pain, hearing loss, sinus pain and sore throat.   Eyes:  Negative for blurred vision and  double vision.  Respiratory:  Negative for cough, shortness of breath and wheezing.   Cardiovascular:  Positive for chest pain. Negative for palpitations and leg swelling.  Gastrointestinal:  Negative for abdominal pain, blood in stool, constipation, diarrhea, heartburn, melena, nausea and vomiting.  Genitourinary:  Negative for dysuria, flank pain, frequency, hematuria and urgency.  Musculoskeletal:  Negative for back pain, joint pain and myalgias.  Skin:  Negative for itching and rash.  Neurological:  Positive for dizziness and headaches. Negative for tingling, tremors, sensory change, speech change, focal weakness, seizures, loss of consciousness and weakness.  Endo/Heme/Allergies:  Negative for environmental allergies. Does not bruise/bleed easily.  Psychiatric/Behavioral:  Negative for depression, hallucinations, memory loss, substance abuse and suicidal ideas. The patient is not nervous/anxious and does not have insomnia.      Physical Exam: BP 125/81   Pulse (!) 107   Temp 98.4 F (36.9 C) (Oral)   Resp 16   LMP 06/27/2021 (Exact Date)   Constitutional: Well nourished, well developed female in no acute distress.  HEENT: normal Skin: Warm and dry.  Cardiovascular: Regular rate and rhythm.   Extremity:  no edema   Respiratory: Clear to auscultation bilateral. Normal respiratory effort Abdomen: FHT present Neuro: DTRs 2+, Cranial nerves grossly intact Psych: Alert and Oriented x3. No memory deficits. Normal mood and affect.   Toco: negative Fetal well being: reactive 30 minute tracing, 140 bpm, moderate variability, positive 10x10 accelerations, -deceleration  Consults: None  Significant Findings/ Diagnostic Studies: none  Procedures: NST  Hospital Course: The patient was admitted to Labor and Delivery Triage for observation.   Discharge Condition: good  Disposition: Discharge disposition: 01-Home or Self Care  Diet: Regular diet  Discharge Activity: Activity as  tolerated  Discharge Instructions     Discharge activity:  No Restrictions   Complete by: As directed    Discharge diet:  No restrictions   Complete by: As directed       Allergies as of 12/16/2021   No Known Allergies      Medication List     TAKE these medications    CVS Prenatal Gummy 0.4-113.5 MG Chew Chew 1 each by mouth daily.   Fusion Plus Caps Take 1 tablet by mouth daily as needed.   potassium chloride SA 20 MEQ tablet Commonly known as: Klor-Con M20 Take 1 tablet (20 mEq total) by mouth daily.        Follow-up Information     [facility]. Go to.   Why: scheduled prenatal appointment Contact information: [address]  [address] [place] [address] [phone]                Total time spent taking care of this patient: 18 minutes  Signed: Tresea Mall, CNM  12/16/2021, 8:24 PM

## 2021-12-16 NOTE — ED Provider Triage Note (Signed)
Emergency Medicine Provider Triage Evaluation Note  Limited by Spanish language.  Tele-interpreter used for triage.  Latoya Perkins, a 21 y.o. G1P0 female  was evaluated in triage.  Pt complains of medical exposure.  Patient was at her new job for the first time today, when she experienced the smell of she thought to be gasoline.  She then began to develop some headache, dizziness, and some central chest pain.  Patient is about [redacted] weeks pregnant with her first pregnancy.  She denies any abdominal pain, cough, or congestion.  She would report however, that she has not felt fetal kicks since this morning.  Review of Systems  Positive: Dizziness, headache, chest pain Negative: NV  Physical Exam  LMP 06/27/2021 (Exact Date)  Gen:   Awake, no distress  NAD Resp:  Normal effort CTA MSK:   Moves extremities without difficulty  ABD:  Gravid, nontender  Medical Decision Making  Medically screening exam initiated at 7:57 PM.  Appropriate orders placed.  Latoya Perkins was informed that the remainder of the evaluation will be completed by another provider, this initial triage assessment does not replace that evaluation, and the importance of remaining in the ED until their evaluation is complete.  Patient to the ED for evaluation of possible chemical exposure.  She reported smell of gasoline at work at her new job.  Patient is also [redacted] weeks gestation with a single complexity, and reports decreased fetal movement since this morning.  She has been subsequently been discharged to L&D for NST and fetal monitoring.   Lissa Hoard, PA-C 12/16/21 2012

## 2021-12-17 NOTE — Discharge Instructions (Signed)
You may take Tylenol 1000 mg every 6 hours as needed for pain. °

## 2021-12-17 NOTE — ED Provider Notes (Signed)
[facility] Provider Note    Event Date/Time   First MD Initiated Contact with Patient 12/16/21 2349     (approximate)   History   Headache and Chest Pain   HPI  Latoya Perkins is a 21 y.o. female who is approximately [redacted] weeks pregnant who presents to the emergency department with complaints of anterior chest pain, headache, lightheadedness and nausea when at work today.  States when she got to work she started smelling gas and then became symptomatic.  States now since being here in the hospital her symptoms have completely resolved.  No shortness of breath, fever, cough, vomiting, diarrhea, vaginal bleeding or discharge, leaking fluid.  She is having normal fetal movements.  No numbness, tingling or weakness.  Patient has already been seen and cleared by OB/GYN.   History provided by patient using Spanish interpreter.    History reviewed. No pertinent past medical history.  History reviewed. No pertinent surgical history.  MEDICATIONS:  Prior to Admission medications   Medication Sig Start Date End Date Taking? Authorizing Provider  Iron-FA-B Cmp-C-Biot-Probiotic (FUSION PLUS) CAPS Take 1 tablet by mouth daily as needed. 12/08/21   Doreene Burke, CNM  potassium chloride SA (KLOR-CON M20) 20 MEQ tablet Take 1 tablet (20 mEq total) by mouth daily. 11/28/21   Minna Antis, MD  Prenatal Vit-Min-FA-Fish Oil ([facility]S PRENATAL GUMMY) 0.4-113.5 MG CHEW Chew 1 each by mouth daily. 09/07/21   Doreene Burke, CNM    Physical Exam   Triage Vital Signs: ED Triage Vitals  Enc Vitals Group     BP 12/16/21 2128 121/81     Pulse Rate 12/16/21 2128 88     Resp 12/16/21 2128 16     Temp 12/16/21 2128 98.3 F (36.8 C)     Temp Source 12/16/21 2128 Oral     SpO2 12/16/21 2128 100 %     Weight --      Height --      Head Circumference --      Peak Flow --      Pain Score 12/16/21 2154 4     Pain Loc --      Pain Edu? --      Excl. in GC? --      Most recent vital signs: Vitals:   12/16/21 2128  BP: 121/81  Pulse: 88  Resp: 16  Temp: 98.3 F (36.8 C)  SpO2: 100%    CONSTITUTIONAL: Alert and oriented and responds appropriately to questions. Well-appearing; well-nourished HEAD: Normocephalic, atraumatic EYES: Conjunctivae clear, pupils appear equal, sclera nonicteric ENT: normal nose; moist mucous membranes NECK: Supple, normal ROM CARD: RRR; S1 and S2 appreciated; no murmurs, no clicks, no rubs, no gallops RESP: Normal chest excursion without splinting or tachypnea; breath sounds clear and equal bilaterally; no wheezes, no rhonchi, no rales, no hypoxia or respiratory distress, speaking full sentences ABD/GI: Normal bowel sounds; gravid uterus soft, non-tender, no rebound, no guarding, no peritoneal signs BACK: The back appears normal EXT: Normal ROM in all joints; no deformity noted, no edema; no cyanosis SKIN: Normal color for age and race; warm; no rash on exposed skin NEURO: Moves all extremities equally, normal speech PSYCH: The patient's mood and manner are appropriate.   ED Results / Procedures / Treatments   LABS: (all labs ordered are listed, but only abnormal results are displayed) Labs Reviewed  CBC WITH DIFFERENTIAL/PLATELET - Abnormal; Notable for the following components:      Result Value   WBC  11.0 (*)    RBC 3.39 (*)    Hemoglobin 10.2 (*)    HCT 31.5 (*)    Neutro Abs 8.2 (*)    All other components within normal limits  COMPREHENSIVE METABOLIC PANEL - Abnormal; Notable for the following components:   Potassium 3.4 (*)    Creatinine, Ser 0.40 (*)    Calcium 8.6 (*)    Albumin 3.4 (*)    Anion gap 4 (*)    All other components within normal limits  HCG, QUANTITATIVE, PREGNANCY - Abnormal; Notable for the following components:   hCG, Beta Chain, Quant, S 6,795 (*)    All other components within normal limits  URINALYSIS, ROUTINE W REFLEX MICROSCOPIC - Abnormal; Notable for the following  components:   Color, Urine YELLOW (*)    APPearance HAZY (*)    Ketones, ur 20 (*)    Leukocytes,Ua SMALL (*)    Bacteria, UA RARE (*)    All other components within normal limits  BLOOD GAS, VENOUS - Abnormal; Notable for the following components:   pCO2, Ven 41 (*)    pO2, Ven <31 (*)    All other components within normal limits  COOXEMETRY PANEL - Abnormal; Notable for the following components:   Total hemoglobin 11.0 (*)    Carboxyhemoglobin <0.3 (*)    All other components within normal limits  LACTIC ACID, PLASMA  LACTIC ACID, PLASMA  TROPONIN I (HIGH SENSITIVITY)  TROPONIN I (HIGH SENSITIVITY)     EKG:  EKG Interpretation  Date/Time:  Wednesday December 16 2021 22:02:08 EDT Ventricular Rate:  100 PR Interval:  132 QRS Duration: 78 QT Interval:  326 QTC Calculation: 420 R Axis:   63 Text Interpretation: Normal sinus rhythm Normal ECG When compared with ECG of 28-Nov-2021 00:11, PREVIOUS ECG IS PRESENT Confirmed by Rochele Raring [phone]) on 12/17/2021 12:17:09 AM         RADIOLOGY: My personal review and interpretation of imaging: Chest x-ray clear.  I have personally reviewed all radiology reports.   DG Chest 2 View  Result Date: 12/16/2021 CLINICAL DATA:  Chest pain. EXAM: CHEST - 2 VIEW COMPARISON:  Chest radiograph dated 03/13/2021. FINDINGS: The lungs are clear. There is no pleural effusion pneumothorax. The cardiac silhouette is within limits. Mild scoliosis. No acute osseous pathology. Bilateral nipple shadows. IMPRESSION: No active cardiopulmonary disease. Electronically Signed   By: Elgie Collard M.D.   On: 12/16/2021 23:50     PROCEDURES:  Critical Care performed: No   .1-3 Lead EKG Interpretation  Performed by: Jissell Trafton, Layla Maw, DO Authorized by: Anija Brickner, Layla Maw, DO     Interpretation: normal     ECG rate:  88   ECG rate assessment: normal     Rhythm: sinus rhythm     Ectopy: none     Conduction: normal       IMPRESSION / MDM / ASSESSMENT  AND PLAN / ED COURSE  I reviewed the triage vital signs and the nursing notes.    Patient here with complaints of headache, chest pain, lightheadedness and nausea after smelling gas at work.  Now asymptomatic.  The patient is on the cardiac monitor to evaluate for evidence of arrhythmia and/or significant heart rate changes.   DIFFERENTIAL DIAGNOSIS (includes but not limited to):   Exposure to natural gas, carbon oxide poisoning, less likely ACS, PE, dissection, cavernous sinus thrombosis, stroke, intracranial hemorrhage, meningitis   Patient's presentation is most consistent with acute presentation with potential threat to life  or bodily function.   PLAN: Labs obtained from triage including CBC, CMP, hCG, urinalysis, troponin, blood gas with coox panel.  Chest x-ray obtained.  EKG is nonischemic.  Chest x-ray reviewed/interpreted by myself radiology and shows no edema, pneumothorax, infiltrate.  Normal VBG and coox panel.  Labs show a mild leukocytosis and anemia which is chronic.  Normal electrolytes, renal function and LFTs.  Urine does show small leukocyte esterase and rare bacteria but no other sign of infection.  Troponin is negative.  Patient reports that she is feeling much better.  Hemodynamically stable here without hypoxia.  Exam is reassuring.  She is having normal fetal movements with no vaginal bleeding, leaking fluid and normal fetal heart tones.  I feel she is safe for discharge home.  Discussed with her that she should talk to her supervisor about the gas smell so that someone from the gas company or fire department can come to her job site to inspect.   MEDICATIONS GIVEN IN ED: Medications - No data to display   ED COURSE:  At this time, I do not feel there is any life-threatening condition present. I reviewed all nursing notes, vitals, pertinent previous records.  All lab and urine results, EKGs, imaging ordered have been independently reviewed and interpreted by myself.  I  reviewed all available radiology reports from any imaging ordered this visit.  Based on my assessment, I feel the patient is safe to be discharged home without further emergent workup and can continue workup as an outpatient as needed. Discussed all findings, treatment plan as well as usual and customary return precautions.  They verbalize understanding and are comfortable with this plan.  Outpatient follow-up has been provided as needed.  All questions have been answered.    CONSULTS: No admission required at this time.  Work-up is reassuring and patient now asymptomatic.  Carboxyhemoglobin level is less than 0.3.   OUTSIDE RECORDS REVIEWED: Reviewed patient's last OB/GYN note with Doreene Burke on 12/03/2021.       FINAL CLINICAL IMPRESSION(S) / ED DIAGNOSES   Final diagnoses:  Atypical chest pain  Nonintractable headache, unspecified chronicity pattern, unspecified headache type     Rx / DC Orders   ED Discharge Orders     None        Note:  This document was prepared using Dragon voice recognition software and may include unintentional dictation errors.   Jennifr Gaeta, Layla Maw, DO 12/17/21 0020

## 2021-12-17 NOTE — ED Notes (Signed)
E-signature pad unavailable - Pt verbalized understanding of D/C information - no additional concerns at this time.  

## 2022-01-06 ENCOUNTER — Ambulatory Visit (INDEPENDENT_AMBULATORY_CARE_PROVIDER_SITE_OTHER): Payer: Medicaid Other | Admitting: Certified Nurse Midwife

## 2022-01-06 VITALS — BP 113/72 | HR 99 | Wt 99.5 lb

## 2022-01-06 DIAGNOSIS — Z3A27 27 weeks gestation of pregnancy: Secondary | ICD-10-CM

## 2022-01-06 DIAGNOSIS — Z23 Encounter for immunization: Secondary | ICD-10-CM

## 2022-01-06 DIAGNOSIS — Z3403 Encounter for supervision of normal first pregnancy, third trimester: Secondary | ICD-10-CM

## 2022-01-06 LAB — POCT URINALYSIS DIPSTICK OB
Appearance: NEGATIVE
Bilirubin, UA: NEGATIVE
Blood, UA: NEGATIVE
Glucose, UA: NEGATIVE
Ketones, UA: NEGATIVE
Leukocytes, UA: NEGATIVE
Nitrite, UA: NEGATIVE
POC,PROTEIN,UA: NEGATIVE
Spec Grav, UA: 1.025 (ref 1.010–1.025)
Urobilinogen, UA: 0.2 E.U./dL
pH, UA: 6 (ref 5.0–8.0)

## 2022-01-06 MED ORDER — TETANUS-DIPHTH-ACELL PERTUSSIS 5-2.5-18.5 LF-MCG/0.5 IM SUSY
0.5000 mL | PREFILLED_SYRINGE | Freq: Once | INTRAMUSCULAR | Status: AC
  Administered 2022-01-06: 0.5 mL via INTRAMUSCULAR

## 2022-01-06 NOTE — Progress Notes (Signed)
ROB doing well. Feels good movement. 28 wk labs today: Glucose screen/RPR/CBC. Tdap, Blood transfusion consent completed, all questions answered. Ready set baby reviewed, see check list for topics covered. Sample birth plan given, will follow up in upcoming visits. Discussed birth control after delivery, information pamphlet given. PT state she maybe traveling in sept to visit family in [place] reviewed flying precautions.   Follow up 2 wk with Missy for ROB or sooner if needed.   Interpretor services used for this visit.   Doreene Burke, CNM

## 2022-01-06 NOTE — Patient Instructions (Signed)
Oral Glucose Tolerance Test During Pregnancy Why am I having this test? The oral glucose tolerance test (OGTT) is done to check how your body processes blood sugar (glucose). This is one of several tests used to diagnose diabetes that develops during pregnancy (gestational diabetes mellitus). Gestational diabetes is a short-term form of diabetes that some women develop while they are pregnant. It usually occurs during the second trimester of pregnancy and goes away after delivery. Testing, or screening, for gestational diabetes usually occurs at weeks 24-28 of pregnancy. You may have the OGTT test after having a 1-hour glucose screening test if the results from that test indicate that you may have gestational diabetes. This test may also be needed if: You have a history of gestational diabetes. There is a history of giving birth to very large babies or of losing pregnancies (having stillbirths). You have signs and symptoms of diabetes, such as: Changes in your eyesight. Tingling or numbness in your hands or feet. Changes in hunger, thirst, and urination, and these are not explained by your pregnancy. What is being tested? This test measures the amount of glucose in your blood at different times during a period of 3 hours. This shows how well your body can process glucose. What kind of sample is taken?  Blood samples are required for this test. They are usually collected by inserting a needle into a blood vessel. How do I prepare for this test? For 3 days before your test, eat normally. Have plenty of carbohydrate-rich foods. Follow instructions from your health care provider about: Eating or drinking restrictions on the day of the test. You may be asked not to eat or drink anything other than water (to fast) starting 8-10 hours before the test. Changing or stopping your regular medicines. Some medicines may interfere with this test. Tell a health care provider about: All medicines you are  taking, including vitamins, herbs, eye drops, creams, and over-the-counter medicines. Any blood disorders you have. Any surgeries you have had. Any medical conditions you have. What happens during the test? First, your blood glucose will be measured. This is referred to as your fasting blood glucose because you fasted before the test. Then, you will drink a glucose solution that contains a certain amount of glucose. Your blood glucose will be measured again 1, 2, and 3 hours after you drink the solution. This test takes about 3 hours to complete. You will need to stay at the testing location during this time. During the testing period: Do not eat or drink anything other than the glucose solution. Do not exercise. Do not use any products that contain nicotine or tobacco, such as cigarettes, e-cigarettes, and chewing tobacco. These can affect your test results. If you need help quitting, ask your health care provider. The testing procedure may vary among health care providers and hospitals. How are the results reported? Your results will be reported as milligrams of glucose per deciliter of blood (mg/dL) or millimoles per liter (mmol/L). There is more than one source for screening and diagnosis reference values used to diagnose gestational diabetes. Your health care provider will compare your results to normal values that were established after testing a large group of people (reference values). Reference values may vary among labs and hospitals. For this test (Carpenter-Coustan), reference values are: Fasting: 95 mg/dL (5.3 mmol/L). 1 hour: 180 mg/dL (10.0 mmol/L). 2 hour: 155 mg/dL (8.6 mmol/L). 3 hour: 140 mg/dL (7.8 mmol/L). What do the results mean? Results below the reference values are   considered normal. If two or more of your blood glucose levels are at or above the reference values, you may be diagnosed with gestational diabetes. If only one level is high, your health care provider may  suggest repeat testing or other tests to confirm a diagnosis. Talk with your health care provider about what your results mean. Questions to ask your health care provider Ask your health care provider, or the department that is doing the test: When will my results be ready? How will I get my results? What are my treatment options? What other tests do I need? What are my next steps? Summary The oral glucose tolerance test (OGTT) is one of several tests used to diagnose diabetes that develops during pregnancy (gestational diabetes mellitus). Gestational diabetes is a short-term form of diabetes that some women develop while they are pregnant. You may have the OGTT test after having a 1-hour glucose screening test if the results from that test show that you may have gestational diabetes. You may also have this test if you have any symptoms or risk factors for this type of diabetes. Talk with your health care provider about what your results mean. This information is not intended to replace advice given to you by your health care provider. Make sure you discuss any questions you have with your health care provider. Document Revised: 10/25/2019 Document Reviewed: 10/25/2019 Elsevier Patient Education  2023 Elsevier Inc.  

## 2022-01-07 ENCOUNTER — Encounter: Payer: Self-pay | Admitting: Certified Nurse Midwife

## 2022-01-07 ENCOUNTER — Other Ambulatory Visit: Payer: Self-pay | Admitting: Certified Nurse Midwife

## 2022-01-07 DIAGNOSIS — O99012 Anemia complicating pregnancy, second trimester: Secondary | ICD-10-CM

## 2022-01-07 LAB — CBC
Hematocrit: 28.9 % — ABNORMAL LOW (ref 34.0–46.6)
Hemoglobin: 9.7 g/dL — ABNORMAL LOW (ref 11.1–15.9)
MCH: 29.3 pg (ref 26.6–33.0)
MCHC: 33.6 g/dL (ref 31.5–35.7)
MCV: 87 fL (ref 79–97)
Platelets: 304 10*3/uL (ref 150–450)
RBC: 3.31 x10E6/uL — ABNORMAL LOW (ref 3.77–5.28)
RDW: 11.4 % — ABNORMAL LOW (ref 11.7–15.4)
WBC: 10.1 10*3/uL (ref 3.4–10.8)

## 2022-01-07 LAB — RPR: RPR Ser Ql: NONREACTIVE

## 2022-01-07 LAB — GLUCOSE, 1 HOUR GESTATIONAL: Gestational Diabetes Screen: 78 mg/dL (ref 70–139)

## 2022-01-20 ENCOUNTER — Encounter: Payer: Medicaid Other | Admitting: Obstetrics

## 2022-02-03 ENCOUNTER — Encounter: Payer: Medicaid Other | Admitting: Certified Nurse Midwife

## 2023-07-23 IMAGING — CR DG CHEST 2V
2 series · 2 of 2 positions shown · non-contrast
Comparison: None.

CLINICAL DATA: Cough

EXAM:
CHEST - 2 VIEW

[chest pa]
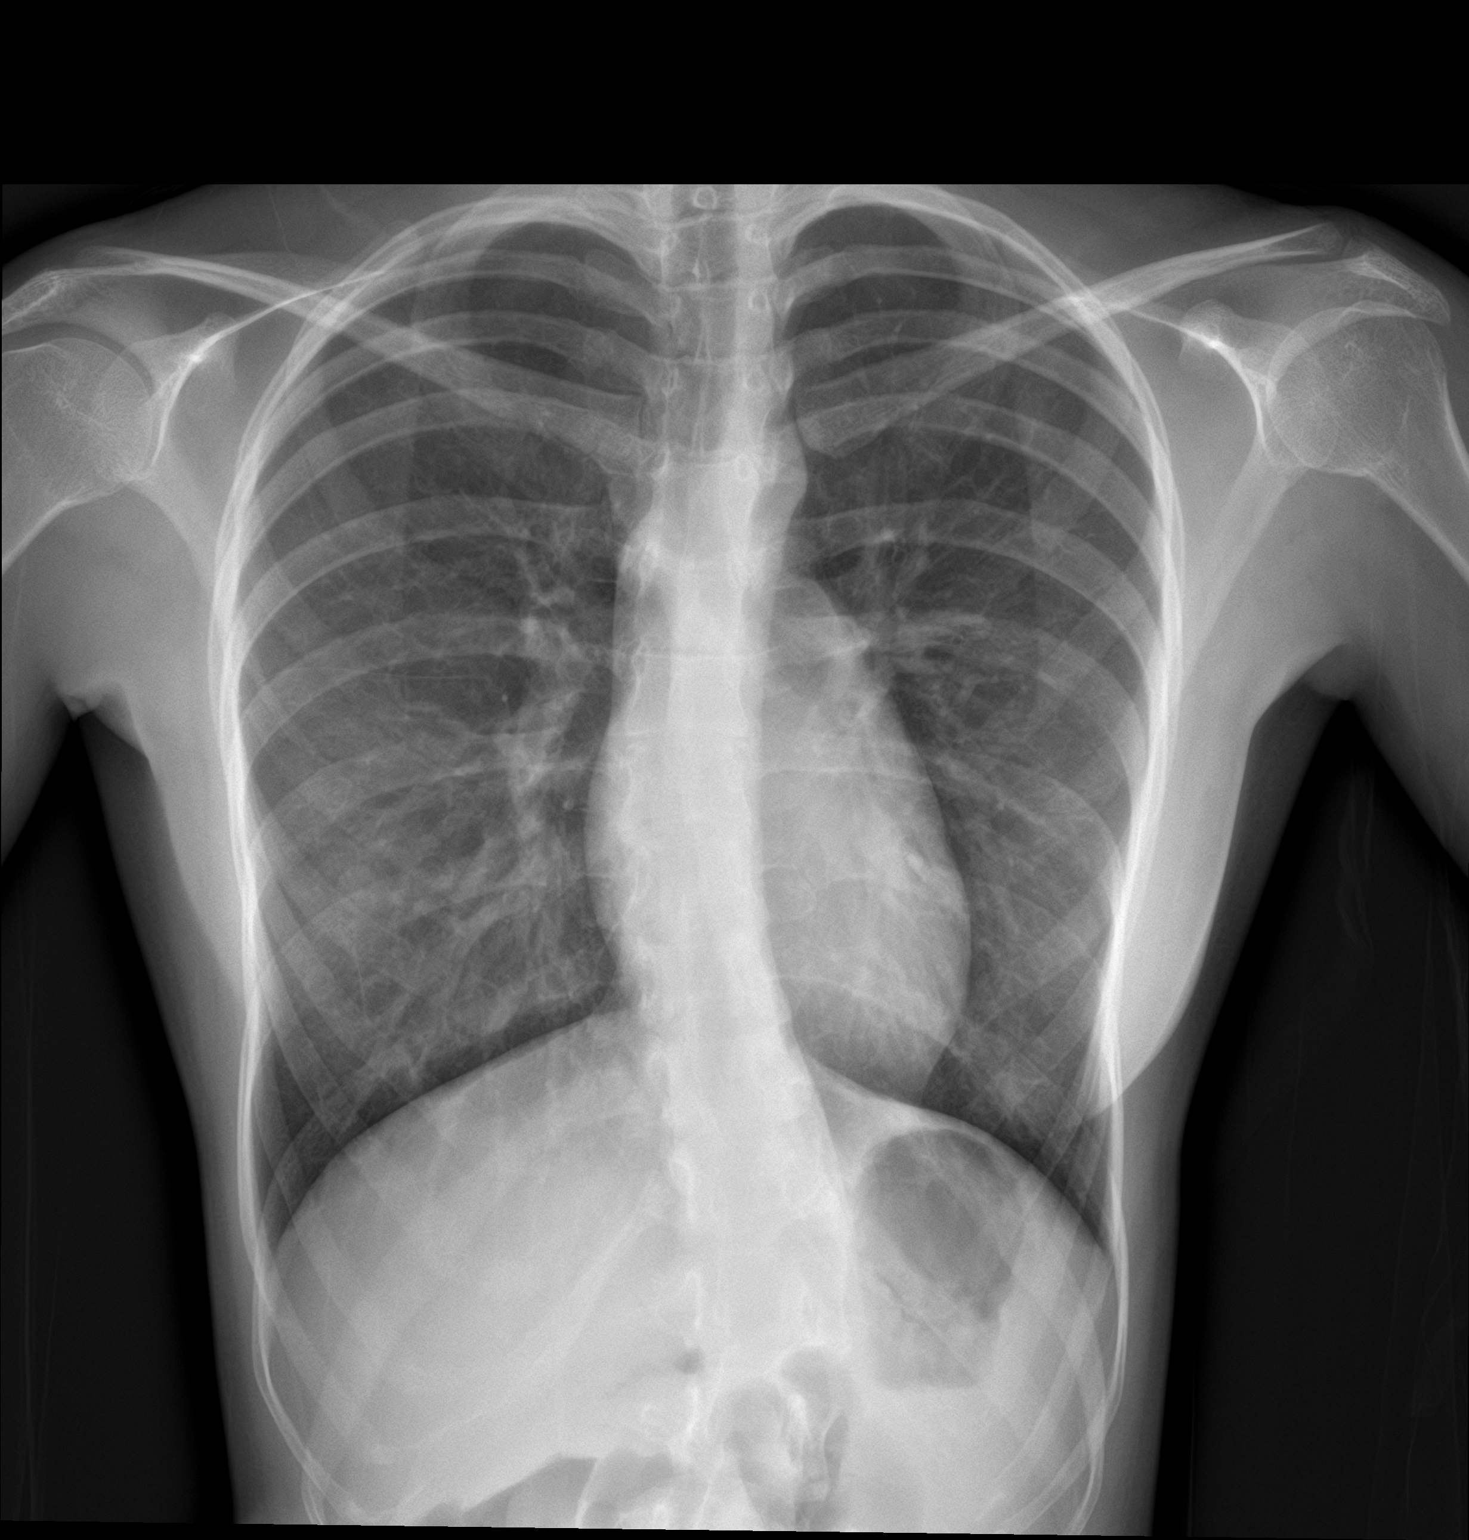

[chest lat]
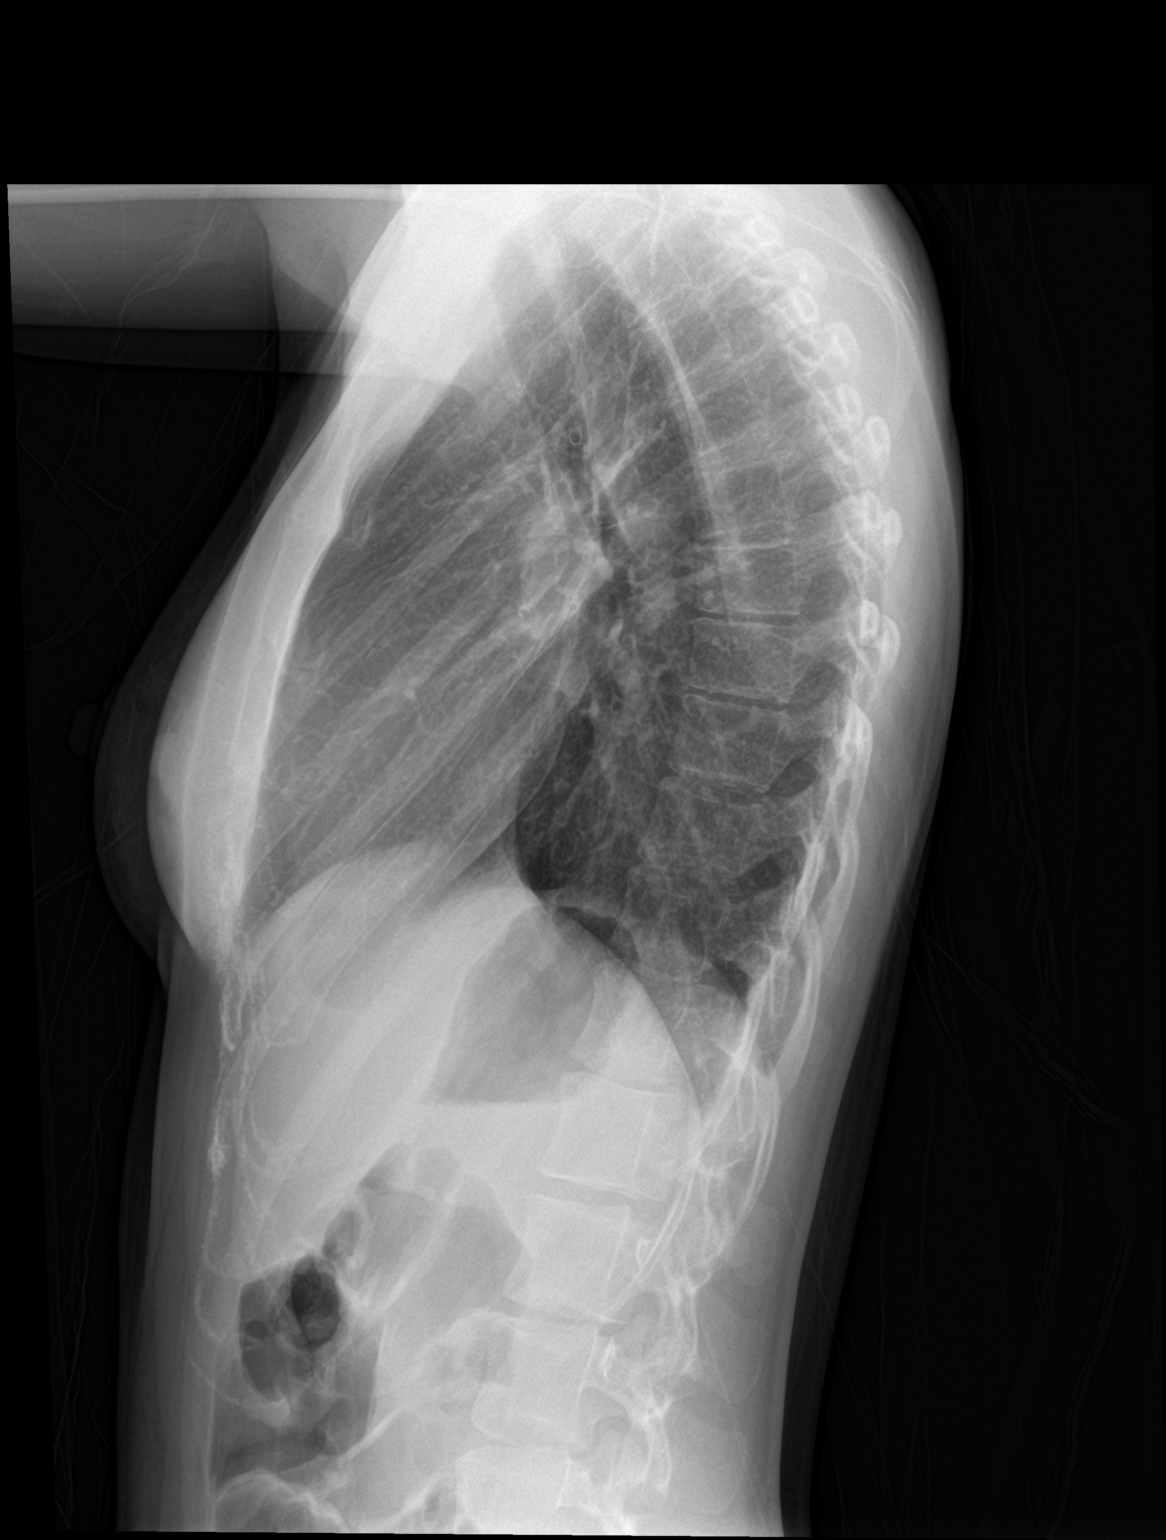

[2 of 2 positions shown; findings below may reference images not displayed]

FINDINGS: The heart size and mediastinal contours are within normal limits.
Both lungs are clear. Scoliotic curvature of the thoracolumbar
spine.
IMPRESSION: No acute cardiopulmonary disease.
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
